# Patient Record
Sex: Female | Born: 1975 | Race: Black or African American | Hispanic: No | Marital: Single | Smoking: Never smoker
Health system: Southern US, Community
[De-identification: ages and names within clinical notes are randomized; demographics above are authoritative.]

## PROBLEM LIST (undated history)

## (undated) DIAGNOSIS — I10 Essential (primary) hypertension: Secondary | ICD-10-CM

## (undated) DIAGNOSIS — R87629 Unspecified abnormal cytological findings in specimens from vagina: Secondary | ICD-10-CM

## (undated) DIAGNOSIS — L02224 Furuncle of groin: Secondary | ICD-10-CM

## (undated) DIAGNOSIS — IMO0002 Reserved for concepts with insufficient information to code with codable children: Secondary | ICD-10-CM

## (undated) DIAGNOSIS — D249 Benign neoplasm of unspecified breast: Secondary | ICD-10-CM

## (undated) HISTORY — DX: Reserved for concepts with insufficient information to code with codable children: IMO0002

## (undated) HISTORY — DX: Unspecified abnormal cytological findings in specimens from vagina: R87.629

## (undated) HISTORY — DX: Essential (primary) hypertension: I10

## (undated) HISTORY — DX: Benign neoplasm of unspecified breast: D24.9

## (undated) HISTORY — DX: Furuncle of groin: L02.224

## (undated) HISTORY — PX: OTHER SURGICAL HISTORY: SHX169

---

## 2005-11-05 ENCOUNTER — Ambulatory Visit: Payer: Self-pay | Admitting: Family Medicine

## 2005-11-05 ENCOUNTER — Other Ambulatory Visit: Admission: RE | Admit: 2005-11-05 | Discharge: 2005-11-05 | Payer: Self-pay | Admitting: Family Medicine

## 2005-11-08 LAB — HM MAMMOGRAPHY: HM Mammogram: ABNORMAL

## 2006-01-23 ENCOUNTER — Ambulatory Visit: Payer: Self-pay | Admitting: Family Medicine

## 2006-10-07 DIAGNOSIS — L708 Other acne: Secondary | ICD-10-CM | POA: Insufficient documentation

## 2007-04-20 ENCOUNTER — Encounter: Payer: Self-pay | Admitting: Family Medicine

## 2007-04-21 ENCOUNTER — Encounter: Payer: Self-pay | Admitting: Family Medicine

## 2007-04-21 ENCOUNTER — Other Ambulatory Visit: Admission: RE | Admit: 2007-04-21 | Discharge: 2007-04-21 | Payer: Self-pay | Admitting: Family Medicine

## 2007-04-21 ENCOUNTER — Ambulatory Visit: Payer: Self-pay | Admitting: Family Medicine

## 2007-04-21 ENCOUNTER — Encounter (INDEPENDENT_AMBULATORY_CARE_PROVIDER_SITE_OTHER): Payer: Self-pay | Admitting: *Deleted

## 2007-04-21 DIAGNOSIS — A630 Anogenital (venereal) warts: Secondary | ICD-10-CM

## 2007-04-21 LAB — CONVERTED CEMR LAB

## 2007-04-22 ENCOUNTER — Telehealth (INDEPENDENT_AMBULATORY_CARE_PROVIDER_SITE_OTHER): Payer: Self-pay | Admitting: *Deleted

## 2007-04-23 ENCOUNTER — Telehealth (INDEPENDENT_AMBULATORY_CARE_PROVIDER_SITE_OTHER): Payer: Self-pay | Admitting: *Deleted

## 2007-04-28 ENCOUNTER — Telehealth (INDEPENDENT_AMBULATORY_CARE_PROVIDER_SITE_OTHER): Payer: Self-pay | Admitting: *Deleted

## 2008-03-30 ENCOUNTER — Ambulatory Visit: Payer: Self-pay | Admitting: Family Medicine

## 2008-03-30 ENCOUNTER — Other Ambulatory Visit: Admission: RE | Admit: 2008-03-30 | Discharge: 2008-03-30 | Payer: Self-pay | Admitting: Family Medicine

## 2008-03-30 ENCOUNTER — Encounter: Payer: Self-pay | Admitting: Family Medicine

## 2008-03-30 DIAGNOSIS — N760 Acute vaginitis: Secondary | ICD-10-CM | POA: Insufficient documentation

## 2008-03-30 DIAGNOSIS — R000 Tachycardia, unspecified: Secondary | ICD-10-CM

## 2008-03-31 ENCOUNTER — Encounter: Payer: Self-pay | Admitting: Family Medicine

## 2008-03-31 LAB — CONVERTED CEMR LAB
Clue Cells Wet Prep HPF POC: NONE SEEN
Hemoglobin: 13.1 g/dL (ref 12.0–15.0)
MCHC: 32 g/dL (ref 30.0–36.0)
RBC: 4.58 M/uL (ref 3.87–5.11)
WBC, Wet Prep HPF POC: NONE SEEN
WBC: 5.9 10*3/uL (ref 4.0–10.5)

## 2008-05-03 ENCOUNTER — Ambulatory Visit: Payer: Self-pay | Admitting: Family Medicine

## 2008-05-03 DIAGNOSIS — J019 Acute sinusitis, unspecified: Secondary | ICD-10-CM

## 2008-06-01 ENCOUNTER — Telehealth: Payer: Self-pay | Admitting: Family Medicine

## 2008-06-09 ENCOUNTER — Other Ambulatory Visit: Admission: RE | Admit: 2008-06-09 | Discharge: 2008-06-09 | Payer: Self-pay | Admitting: Family Medicine

## 2008-06-09 ENCOUNTER — Encounter: Payer: Self-pay | Admitting: Family Medicine

## 2008-06-09 ENCOUNTER — Ambulatory Visit: Payer: Self-pay | Admitting: Family Medicine

## 2008-06-09 LAB — CONVERTED CEMR LAB
ALT: 16 units/L (ref 0–35)
AST: 16 units/L (ref 0–37)
Albumin: 4 g/dL (ref 3.5–5.2)
Calcium: 9.5 mg/dL (ref 8.4–10.5)
Chloride: 105 meq/L (ref 96–112)
LDL Cholesterol: 77 mg/dL (ref 0–99)
Potassium: 3.9 meq/L (ref 3.5–5.3)
Sodium: 138 meq/L (ref 135–145)

## 2008-06-10 ENCOUNTER — Encounter: Payer: Self-pay | Admitting: Family Medicine

## 2008-06-14 ENCOUNTER — Encounter: Payer: Self-pay | Admitting: Family Medicine

## 2008-06-14 ENCOUNTER — Telehealth: Payer: Self-pay | Admitting: Family Medicine

## 2008-06-14 LAB — CONVERTED CEMR LAB
Trich, Wet Prep: NONE SEEN
WBC, Wet Prep HPF POC: NONE SEEN
Yeast Wet Prep HPF POC: NONE SEEN

## 2008-06-17 ENCOUNTER — Telehealth: Payer: Self-pay | Admitting: Family Medicine

## 2008-08-29 ENCOUNTER — Ambulatory Visit: Payer: Self-pay | Admitting: Family Medicine

## 2008-08-29 DIAGNOSIS — G44209 Tension-type headache, unspecified, not intractable: Secondary | ICD-10-CM

## 2008-08-29 DIAGNOSIS — I959 Hypotension, unspecified: Secondary | ICD-10-CM | POA: Insufficient documentation

## 2009-01-03 ENCOUNTER — Ambulatory Visit: Payer: Self-pay | Admitting: Family Medicine

## 2009-01-03 ENCOUNTER — Encounter: Admission: RE | Admit: 2009-01-03 | Discharge: 2009-01-03 | Payer: Self-pay | Admitting: Family Medicine

## 2009-01-03 DIAGNOSIS — R05 Cough: Secondary | ICD-10-CM | POA: Insufficient documentation

## 2009-01-03 DIAGNOSIS — R071 Chest pain on breathing: Secondary | ICD-10-CM

## 2009-01-04 ENCOUNTER — Encounter: Payer: Self-pay | Admitting: Family Medicine

## 2009-01-04 LAB — CONVERTED CEMR LAB
Basophils Absolute: 0 10*3/uL (ref 0.0–0.1)
Eosinophils Absolute: 0 10*3/uL (ref 0.0–0.7)
Lymphocytes Relative: 38 % (ref 12–46)
Lymphs Abs: 2.4 10*3/uL (ref 0.7–4.0)
MCV: 85.9 fL (ref 78.0–100.0)
Neutrophils Relative %: 54 % (ref 43–77)
Platelets: 314 10*3/uL (ref 150–400)
RDW: 12.8 % (ref 11.5–15.5)
WBC: 6.1 10*3/uL (ref 4.0–10.5)

## 2009-03-20 ENCOUNTER — Ambulatory Visit: Payer: Self-pay | Admitting: Family Medicine

## 2009-03-20 DIAGNOSIS — L909 Atrophic disorder of skin, unspecified: Secondary | ICD-10-CM | POA: Insufficient documentation

## 2009-03-20 DIAGNOSIS — L919 Hypertrophic disorder of the skin, unspecified: Secondary | ICD-10-CM

## 2009-09-22 ENCOUNTER — Telehealth (INDEPENDENT_AMBULATORY_CARE_PROVIDER_SITE_OTHER): Payer: Self-pay | Admitting: *Deleted

## 2009-09-28 ENCOUNTER — Encounter: Payer: Self-pay | Admitting: Family Medicine

## 2009-09-28 ENCOUNTER — Other Ambulatory Visit: Admission: RE | Admit: 2009-09-28 | Discharge: 2009-09-28 | Payer: Self-pay | Admitting: Family Medicine

## 2009-09-28 ENCOUNTER — Ambulatory Visit: Payer: Self-pay | Admitting: Family Medicine

## 2009-09-28 DIAGNOSIS — R635 Abnormal weight gain: Secondary | ICD-10-CM

## 2009-10-19 ENCOUNTER — Telehealth (INDEPENDENT_AMBULATORY_CARE_PROVIDER_SITE_OTHER): Payer: Self-pay | Admitting: *Deleted

## 2010-11-20 ENCOUNTER — Ambulatory Visit: Payer: Self-pay | Admitting: Family Medicine

## 2010-11-20 ENCOUNTER — Other Ambulatory Visit: Admission: RE | Admit: 2010-11-20 | Discharge: 2010-11-20 | Payer: Self-pay | Admitting: Family Medicine

## 2010-11-21 LAB — CONVERTED CEMR LAB
ALT: 16 units/L (ref 0–35)
Albumin: 4.2 g/dL (ref 3.5–5.2)
Alkaline Phosphatase: 64 units/L (ref 39–117)
Glucose, Bld: 97 mg/dL (ref 70–99)
HCV Ab: NEGATIVE
HIV: NONREACTIVE
LDL Cholesterol: 98 mg/dL (ref 0–99)
Potassium: 4 meq/L (ref 3.5–5.3)
Sodium: 138 meq/L (ref 135–145)
Total Bilirubin: 0.6 mg/dL (ref 0.3–1.2)
Total Protein: 7 g/dL (ref 6.0–8.3)
Triglycerides: 90 mg/dL (ref <150)
VLDL: 18 mg/dL (ref 0–40)

## 2010-11-26 LAB — CONVERTED CEMR LAB: Pap Smear: NEGATIVE

## 2011-01-29 NOTE — Assessment & Plan Note (Signed)
Summary: CPE with pap   Vital Signs:  Patient profile:   35 year old female Menstrual status:  regular LMP:     11/13/2010 Height:      67 inches Weight:      193 pounds BMI:     30.34 O2 Sat:      99 % on Room air Pulse rate:   105 / minute BP sitting:   135 / 88  (left arm) Cuff size:   large  Vitals Entered By: Payton Spark CMA (November 20, 2010 2:57 PM)  O2 Flow:  Room air CC: CPE w/ pap LMP (date): 11/13/2010     Menstrual Status regular Enter LMP: 11/13/2010 Last PAP Result NEGATIVE FOR INTRAEPITHELIAL LESIONS OR MALIGNANCY.   Primary Care Provider:  Seymour Bars DO  CC:  CPE w/ pap.  History of Present Illness: 35 yo AAF  presents for CPE with pap.  She got married this year but is now going thru a divorce.  She has  had an abnormal pap smear in 1999.Marland Kitchen  She is still working as a Engineer, civil (consulting).  She has gained 13 lbs this year with the stress of separation and upcoming divorce.  Denies fam hx of premature heart dz, colon cancer or breast cancer.  Not taking her vitamins and is not exercising.  Tetanus UTD.  Getting flu shot at work.  Would like STD testing done.  Current Medications (verified): 1)  Camila 0.35 Mg Tabs (Norethindrone (Contraceptive)) .Marland Kitchen.. 1 Tab By Mouth Daily  Allergies (verified): No Known Drug Allergies  Past History:  Past Medical History: recurrent R groin boil G0  Past Surgical History: Reviewed history from 04/20/2007 and no changes required. breast ultrasound- L breast solid or complex nodule  L breast needle bx: fibroadenoma  Family History: Reviewed history from 04/20/2007 and no changes required. brother healthy  grandfather- diabetes, stroke  mother - epilepsy and father alive, both w/ HTN  Social History: Reviewed history from 10/07/2006 and no changes required. 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  [place] from [place] in 2005.  Has boyfriend, sexually active, uses condoms.  Works 3rd shift.  Poor diet, no exercise.  Nonsmoker.   G0.  Review of Systems       The patient complains of weight gain.  The patient denies anorexia, fever, weight loss, vision loss, decreased hearing, hoarseness, chest pain, syncope, dyspnea on exertion, peripheral edema, prolonged cough, headaches, hemoptysis, abdominal pain, melena, hematochezia, severe indigestion/heartburn, hematuria, incontinence, genital sores, muscle weakness, suspicious skin lesions, transient blindness, difficulty walking, depression, unusual weight change, abnormal bleeding, enlarged lymph nodes, angioedema, breast masses, and testicular masses.    Physical Exam  General:  alert, well-developed, well-nourished, well-hydrated, and overweight-appearing.   Head:  normocephalic and atraumatic.   Eyes:  pupils equal, pupils round, and pupils reactive to light.   Ears:  no external deformities.   Nose:  no nasal discharge.   Mouth:  good dentition and pharynx pink and moist.   Neck:  no masses.   Breasts:  No mass, nodules, thickening, tenderness, bulging, retraction, inflamation, nipple discharge or skin changes noted.   Lungs:  Normal respiratory effort, chest expands symmetrically. Lungs are clear to auscultation, no crackles or wheezes. Heart:  Normal rate and regular rhythm. S1 and S2 normal without gallop, murmur, click, rub or other extra sounds. Abdomen:  Bowel sounds positive,abdomen soft and non-tender without masses, organomegaly or hernias noted. Genitalia:  Pelvic Exam:        External: normal female genitalia without  lesions or masses        Vagina: normal without lesions or masses        Cervix: normal without lesions or masses        Adnexa: normal bimanual exam without masses or fullness        Uterus: normal by palpation        Pap smear: performed Pulses:  2+ radial and pedal pulses Extremities:  no LE edema Skin:  color normal.   Cervical Nodes:  No lymphadenopathy noted Psych:  good eye contact, not anxious appearing, and not depressed appearing.      Impression & Recommendations:  Problem # 1:  ROUTINE GYNECOLOGICAL EXAMINATION (ICD-V72.31) Keeping healthy checklist for women reviewed. BP  in pre - HTN range.  BMI 30 c/w class I obesity. Work on [employer], regular exercise, wt loss. Pap and STD testing done today. Tetanus UTD. Update fasting labs. Call if any worsening in mood given life stressors. Continue Camilla, RFd. RTC 1 yr, sooner if needed.  Complete Medication List: 1)  Camila 0.35 Mg Tabs (Norethindrone (contraceptive)) .Marland Kitchen.. 1 tab by mouth daily  Other Orders: T-Comprehensive Metabolic Panel [id number]) T-Lipid Profile [id number]) T-Hepatitis C Antibody ([id number]) T-HIV Antibody  (Reflex) [id number]) T-RPR (Syphilis) ([id number])  Patient Instructions: 1)  Will call you with pap smear results next wk. 2)  Update fasting labs one morning. 3)  Will call you w/ results. 4)  Work on [employer], regular exercise. 5)  Call if any worsening in mood. 6)  Camilla RFd. 7)  Return in 1 yr, sooner if needed. Prescriptions: CAMILA 0.35 MG TABS (NORETHINDRONE (CONTRACEPTIVE)) 1 tab by mouth daily  #1 pack x 12   Entered and Authorized by:   Seymour Bars DO   Signed by:   Seymour Bars DO on 11/20/2010   Method used:   Print then Give to Patient   RxID:   [id number]    Orders Added: 1)  T-Comprehensive Metabolic Panel [[id number]] 2)  T-Lipid Profile [[id number]] 3)  T-Hepatitis C Antibody [[id number]] 4)  T-HIV Antibody  (Reflex) [[id number]] 5)  T-RPR (Syphilis) [[id number]] 6)  Est. Patient age 47-39 [phone]

## 2011-09-08 ENCOUNTER — Encounter: Payer: Self-pay | Admitting: Family Medicine

## 2011-09-09 ENCOUNTER — Ambulatory Visit (INDEPENDENT_AMBULATORY_CARE_PROVIDER_SITE_OTHER): Payer: PRIVATE HEALTH INSURANCE | Admitting: Family Medicine

## 2011-09-09 VITALS — BP 146/96 | HR 135 | Wt 193.0 lb

## 2011-09-09 DIAGNOSIS — N9489 Other specified conditions associated with female genital organs and menstrual cycle: Secondary | ICD-10-CM

## 2011-09-09 DIAGNOSIS — N949 Unspecified condition associated with female genital organs and menstrual cycle: Secondary | ICD-10-CM

## 2011-09-09 MED ORDER — VALACYCLOVIR HCL 1 G PO TABS: 2000.0000 mg | ORAL_TABLET | Freq: Two times a day (BID) | ORAL | Status: DC

## 2011-09-09 NOTE — Progress Notes (Signed)
  Subjective:    Patient ID: Misty Hodge, female    DOB: 1976/09/03, 35 y.o.   MRN: [id number]  HPI  About a year ago she had a divorce had neg STD testing.  Boyfriend recently dx with HSV.  She is now worried he may have given it her.  About 7 days ago noticed a rash and then turned into a bump.  Burned initially when water would hit it.  Now looks like like an open area. No drainage. No fever or aches.  No topical tx. She says she always uses condoms.   Review of Systems     Objective:   Physical Exam Ulcerated area on the Lt inner buttock approx 1 cm . Appears to be healing. Looks more dry.        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 It is alredy healing so didn't do a viral culture as will likely be negative. If gets another lesion then come in and wills swab the area to confirm   BP elevated today - Discussed she will have it checked at [employer] at work an call with results.

## 2011-11-13 ENCOUNTER — Other Ambulatory Visit (HOSPITAL_COMMUNITY)
Admission: RE | Admit: 2011-11-13 | Discharge: 2011-11-13 | Disposition: A | Discharge status: Home / Self Care | Payer: PRIVATE HEALTH INSURANCE | Source: Ambulatory Visit | Attending: Family Medicine | Admitting: Family Medicine

## 2011-11-13 ENCOUNTER — Other Ambulatory Visit: Payer: Self-pay | Admitting: *Deleted

## 2011-11-13 ENCOUNTER — Encounter: Payer: Self-pay | Admitting: Family Medicine

## 2011-11-13 ENCOUNTER — Ambulatory Visit (INDEPENDENT_AMBULATORY_CARE_PROVIDER_SITE_OTHER): Payer: PRIVATE HEALTH INSURANCE | Admitting: Family Medicine

## 2011-11-13 VITALS — BP 153/94 | HR 134 | Wt 196.0 lb

## 2011-11-13 DIAGNOSIS — Z01419 Encounter for gynecological examination (general) (routine) without abnormal findings: Secondary | ICD-10-CM

## 2011-11-13 DIAGNOSIS — Z1159 Encounter for screening for other viral diseases: Secondary | ICD-10-CM | POA: Insufficient documentation

## 2011-11-13 MED ORDER — LISINOPRIL 20 MG PO TABS: 20.0000 mg | ORAL_TABLET | Freq: Every day | ORAL | Status: DC

## 2011-11-13 MED ORDER — MUPIROCIN 2 % EX OINT: TOPICAL_OINTMENT | Freq: Three times a day (TID) | CUTANEOUS | Status: AC

## 2011-11-13 MED ORDER — NORETHINDRONE 0.35 MG PO TABS: 1.0000 | ORAL_TABLET | Freq: Every day | ORAL | Status: DC

## 2011-11-13 NOTE — Progress Notes (Signed)
Subjective:     Misty Hodge is a 35 y.o. female and is here for a comprehensive physical exam. The patient reports no problems.She has gained weight and is not exercising. She says she still has that lesion near her left inner labia. He thought initially it was herpes simplex but the lesion has not resolved. It is otherwise not tender or bothersome. She has noted some numbness in her left buttock radiating into her leg. She experiences initially when she first noticed the lesion. It did seem to have resolved but started again a couple days ago.  History   Social History  . Marital Status: Single    Spouse Name: N/A    Number of Children: N/A  . Years of Education: N/A   Occupational History  . Not on file.   Social History Main Topics  . Smoking status: Never Smoker   . Smokeless tobacco: Not on file  . Alcohol Use: 0.0 - 0.5 oz/week    0-1 drink(s) per week  . Drug Use: Not on file  . Sexually Active: Yes -- Female partner(s)    Birth Control/ Protection: Condom   Other Topics Concern  . Not on file   Social History Narrative   No regular exercise.    Health Maintenance  Topic Date Due  . Pap Smear  10/03/1996  . Influenza Vaccine  09/30/2011  . Tetanus/tdap  04/20/2017    The following portions of the patient's history were reviewed and updated as appropriate: allergies, current medications, past family history, past medical history, past social history, past surgical history and problem list.  Review of Systems A comprehensive review of systems was negative.   Objective:    BP 153/94  Pulse 134  Wt 196 lb (88.905 kg) General appearance: alert, cooperative and appears stated age Head: Normocephalic, without obvious abnormality, atraumatic Eyes: conjunctiva clear, EOMi, PEERLA Ears: normal TM's and external ear canals both ears Nose: Nares normal. Septum midline. Mucosa normal. No drainage or sinus tenderness. Throat: lips, mucosa, and tongue normal; teeth and  gums normal Neck: no adenopathy, no carotid bruit, no JVD, supple, symmetrical, trachea midline and thyroid not enlarged, symmetric, no tenderness/mass/nodules Back: symmetric, no curvature. ROM normal. No CVA tenderness. Lungs: clear to auscultation bilaterally Breasts: normal appearance, no masses or tenderness Heart: regular rate and rhythm, S1, S2 normal, no murmur, click, rub or gallop Abdomen: soft, non-tender; bowel sounds normal; no masses,  no organomegaly Pelvic: cervix normal in appearance, external genitalia normal, no adnexal masses or tenderness, no cervical motion tenderness, rectovaginal septum normal, uterus normal size, shape, and consistency and vagina normal without discharge. She does have a small approximately half a centimeter ulcerated area on the left inner labia near her buttock. It's about the same size as it was at her last exam. Extremities: extremities normal, atraumatic, no cyanosis or edema Pulses: 2+ and symmetric Skin: Skin color, texture, turgor normal. No rashes or lesions Lymph nodes: Cervical, supraclavicular, and axillary nodes normal. Neurologic: Grossly normal    Assessment:    Healthy female exam.    Plan:     See After Visit Summary for Counseling Recommendations  We discussed the importance of weight loss Start a regular exercise program and make sure you are eating a healthy diet Try to eat 4 servings of dairy a day or take a calcium supplement (500mg  twice a day). Your vaccines are up to date.  Will try Bactroban ointment on the lesion her labia. It looks consistent with  an ulcer if it continues to persist then please let me know.

## 2011-11-13 NOTE — Patient Instructions (Signed)
Start a regular exercise program and make sure you are eating a healthy diet Try to eat 4 servings of dairy a day or take a calcium supplement (500mg twice a day). Your vaccines are up to date.   

## 2011-11-14 LAB — COMPLETE METABOLIC PANEL WITH GFR
BUN: 11 mg/dL (ref 6–23)
CO2: 22 mEq/L (ref 19–32)
Calcium: 9 mg/dL (ref 8.4–10.5)
Chloride: 106 mEq/L (ref 96–112)
Creat: 0.71 mg/dL (ref 0.50–1.10)
GFR, Est African American: >89 mL/min/{1.73_m2}
GFR, Est Non African American: >89 mL/min/{1.73_m2}
Glucose, Bld: 99 mg/dL (ref 70–99)
Total Bilirubin: 0.3 mg/dL (ref 0.3–1.2)

## 2011-11-14 LAB — LIPID PANEL
Cholesterol: 146 mg/dL (ref 0–200)
HDL: 49 mg/dL (ref >39)
LDL Cholesterol: 82 mg/dL (ref 0–99)
Triglycerides: 73 mg/dL (ref <150)

## 2012-08-19 ENCOUNTER — Other Ambulatory Visit: Payer: Self-pay | Admitting: Family Medicine

## 2012-11-04 ENCOUNTER — Other Ambulatory Visit: Payer: Self-pay | Admitting: *Deleted

## 2012-11-04 MED ORDER — VALACYCLOVIR HCL 1 G PO TABS: 2000.0000 mg | ORAL_TABLET | Freq: Two times a day (BID) | ORAL | Status: AC

## 2012-12-09 ENCOUNTER — Ambulatory Visit (INDEPENDENT_AMBULATORY_CARE_PROVIDER_SITE_OTHER): Payer: PRIVATE HEALTH INSURANCE | Admitting: Family Medicine

## 2012-12-09 ENCOUNTER — Ambulatory Visit (INDEPENDENT_AMBULATORY_CARE_PROVIDER_SITE_OTHER): Payer: PRIVATE HEALTH INSURANCE

## 2012-12-09 VITALS — BP 157/101 | HR 128 | Temp 98.7°F | Wt 189.0 lb

## 2012-12-09 DIAGNOSIS — R002 Palpitations: Secondary | ICD-10-CM

## 2012-12-09 DIAGNOSIS — R000 Tachycardia, unspecified: Secondary | ICD-10-CM

## 2012-12-09 LAB — COMPLETE METABOLIC PANEL WITH GFR
ALT: 15 U/L (ref 0–35)
AST: 18 U/L (ref 0–37)
Albumin: 4.1 g/dL (ref 3.5–5.2)
BUN: 11 mg/dL (ref 6–23)
CO2: 24 mEq/L (ref 19–32)
Calcium: 9.6 mg/dL (ref 8.4–10.5)
Chloride: 102 mEq/L (ref 96–112)
Creat: 0.72 mg/dL (ref 0.50–1.10)
GFR, Est African American: >89 mL/min
Potassium: 4.3 mEq/L (ref 3.5–5.3)

## 2012-12-09 LAB — CBC
MCH: 29 pg (ref 26.0–34.0)
MCV: 88.6 fL (ref 78.0–100.0)
Platelets: 317 10*3/uL (ref 150–400)
RBC: 5 MIL/uL (ref 3.87–5.11)
RDW: 13.1 % (ref 11.5–15.5)
WBC: 7.8 10*3/uL (ref 4.0–10.5)

## 2012-12-09 NOTE — Progress Notes (Signed)
Chest x-ray is unremarkable, electrolytes are within normal limits, no sign of anemia, awaiting TSH

## 2012-12-09 NOTE — Progress Notes (Signed)
CC: Misty Hodge is a 36 y.o. female is here for chest discomfort   Subjective: HPI:  Patient reports a vibration discomfort in the left anterior chest that started Saturday after no specific precipitating event. He continues to occur off and on throughout the day, it does not wake her from sleep but she wakes up in the night from other reasons she notices is still present. She denies any pain but it is annoying. Nothing seems to make it better or worse. She describes it as a sensation that her cell phone sat on vibrate is sitting on her chest. Vibrations do not move. She had some nausea on Sunday but no interference with appetite or other episodes of nausea. She denies shortness of breath other than after climbing 3 flights of stairs yesterday. She denies orthopnea, fevers, chills, back pain, confusion, motor or sensory disturbances, cough, wheezing.  She describes herself as individual enjoys caffeine, however she does not take any caffeine since Saturday following the onset of her complaints.  There's been no recent immobilization, she is on estrogen, there is no personal history of blood clots, her most recent Pap did not show abnormalities.   Review Of Systems Outlined In HPI  Past Medical History  Diagnosis Date  . Boil, groin     recurrent of RT  . GOA (generalized osteoarthritis)   . Fibroadenoma      Family History  Problem Relation Age of Onset  . Diabetes Maternal Grandfather   . Stroke Maternal Grandfather   . Seizures Mother   . Hypertension Father   . Hypertension Mother      History  Substance Use Topics  . Smoking status: Never Smoker   . Smokeless tobacco: Not on file  . Alcohol Use: 0.0 - 0.5 oz/week    0-1 drink(s) per week     Objective: Filed Vitals:   12/09/12 0913  BP: 157/101  Pulse: 128  Temp: 98.7 F (37.1 C)    General: Alert and Oriented, No Acute Distress HEENT: Pupils equal, round, reactive to light. Conjunctivae clear.  Pink inferior  turbinates.  Moist mucous membranes, pharynx without inflammation nor lesions.  Neck supple without palpable lymphadenopathy nor abnormal masses. Lungs: Clear to auscultation bilaterally, no wheezing/ronchi/rales.  Comfortable work of breathing. Good air movement. Cardiac: Tachycardic Normal S1/S2.  No murmurs, rubs, nor gallops.  In the Extremities: No peripheral edema.  Strong peripheral pulses.  Mental Status: No depression, anxiety, nor agitation. Skin: Warm and dry.  Assessment & Plan: Misty Hodge was seen today for chest discomfort.  Diagnoses and associated orders for this visit:  Palpitations - PR ELECTROCARDIOGRAM, COMPLETE - TSH - COMPLETE METABOLIC PANEL WITH GFR - CBC - DG Chest 2 View; Future    EKG shows sinus tachycardia, normal axis, normal intervals, no pathologic Q waves, no ST segment elevation or depression, good R wave progression, 2 non-coupled PVCs.  Very low suspicion for cardiac ischemia or infarction.  Wells score 1.5, clinically PE is unlikely.  Stat labs above ordered to rule out reversible causes of tachycardia and PVCs. Will contact the patient once his labs are back.Signs and symptoms requring emergent/urgent reevaluation were discussed with the patient. May possibly need event monitor if above labs are normal  40 minutes spent face-to-face during visit today of which at least 50% was counseling or coordinating care.   Followup will be based on results of above

## 2012-12-09 NOTE — Progress Notes (Signed)
Updated patient regarding favorable TSH, CMP, CBC. She is open to the idea of getting set up for an event monitor.

## 2012-12-09 NOTE — Addendum Note (Signed)
Addended by: Laren Boom on: 12/09/2012 04:13 PM   Modules accepted: Orders

## 2012-12-10 ENCOUNTER — Telehealth: Payer: Self-pay | Admitting: *Deleted

## 2012-12-10 ENCOUNTER — Encounter: Payer: Self-pay | Admitting: *Deleted

## 2012-12-10 NOTE — Telephone Encounter (Signed)
Pt states the palpitations are more frequent and now she feels sore in the area that she has the palpitations.Pt wants to know if she can take Ibuprofen for the soreness. Also I scheduled the appt for event monitor for mon. Pt wants to know if its ok to wait until then

## 2012-12-10 NOTE — Telephone Encounter (Signed)
Pt.notified

## 2012-12-10 NOTE — Telephone Encounter (Signed)
Yes it's ok to take Ibuprofen: 400-600mg  every 8 hours as needed.  And based on yesterdays reassuring results I think it's ok to wait until Monday for the monitor. However, if she feels they're continuing to get worse it wouldn't be unreasonable to schedule an appointment tomorrow to discuss whether or not to perform a longer EKG / Rhythm Strip.

## 2013-01-11 ENCOUNTER — Telehealth: Payer: Self-pay | Admitting: Family Medicine

## 2013-01-11 NOTE — Telephone Encounter (Signed)
Call patient: Her monitor results showed that she does not have any abnormal rhythms. Ice it shows normal sinus rhythm with no arrhythmia. This is very reassuring. If she's still having the sensation occurring then please make a followup appointment with me.

## 2013-03-22 ENCOUNTER — Encounter: Payer: Self-pay | Admitting: Family Medicine

## 2013-03-22 ENCOUNTER — Ambulatory Visit (INDEPENDENT_AMBULATORY_CARE_PROVIDER_SITE_OTHER): Payer: PRIVATE HEALTH INSURANCE | Admitting: Family Medicine

## 2013-03-22 VITALS — BP 145/87 | HR 115 | Ht 67.0 in | Wt 180.0 lb

## 2013-03-22 DIAGNOSIS — N920 Excessive and frequent menstruation with regular cycle: Secondary | ICD-10-CM

## 2013-03-22 DIAGNOSIS — N898 Other specified noninflammatory disorders of vagina: Secondary | ICD-10-CM

## 2013-03-22 DIAGNOSIS — Z3009 Encounter for other general counseling and advice on contraception: Secondary | ICD-10-CM

## 2013-03-22 DIAGNOSIS — G44229 Chronic tension-type headache, not intractable: Secondary | ICD-10-CM

## 2013-03-22 MED ORDER — NORETHINDRONE 0.35 MG PO TABS: 1.0000 | ORAL_TABLET | Freq: Every day | ORAL | Status: DC

## 2013-03-22 MED ORDER — NOR-QD 0.35 MG PO TABS: ORAL_TABLET | ORAL | Status: DC

## 2013-03-22 NOTE — Progress Notes (Addendum)
  Subjective:    Patient ID: Misty Hodge, female    DOB: October 15, 1976, 37 y.o.   MRN: [id number]  HPI She's here to discuss her birth control today. She was previously on nor-QD. This really worked well for her. It controlled her periods she was previously having migraines that got worse around her cycle when she was on combination estrogen and progesterone birth control. Thus a couple years ago she switched to progesterone only birth control and that has worked well for her. She says she never misses a dose it always takes it at the same time. She switch to a generic call camila about 6 months ago. About 2 months after switching she has her to having breakthrough bleeding in the middle of her cycle. She does still have headaches around the time of her period she is otherwise not having any abdominal pain pelvic pain, abnormal discharge et Karie Soda. Her Pap smear is up-to-date.  She is getting married in May and wants to have this under control if possible.  Review of Systems     Objective:   Physical Exam  Constitutional: She is oriented to person, place, and time. She appears well-developed and well-nourished.  HENT:  Head: Normocephalic and atraumatic.  Eyes: Conjunctivae and EOM are normal.  Cardiovascular: Normal rate.   Pulmonary/Chest: Effort normal.  Neurological: She is alert and oriented to person, place, and time.  Skin: Skin is dry. No pallor.  Psychiatric: She has a normal mood and affect. Her behavior is normal.          Assessment & Plan:  Contraceptive counseling - Will change to brand Nor-QD. Hopefully this will get her periods better regulated. F/u in 2 months. If still bleeding at this point then consider US of the uterus to evaluate for endometrial hyperplasia. Could also consider switching to accommodation estrogen progesterone birth control pill.   Tension headaches-She still gets headaches around the time of her period. They did get worse after switching to the  camila brand.  We will see if these improve as well getting back on the brand Nor-QD.  If not then consider going back to combination pill.  Elevated blood pressure-her blood pressure is slightly high today. She was on lisinopril at one time. Open 2 months to recheck blood pressure.

## 2014-03-08 ENCOUNTER — Ambulatory Visit (INDEPENDENT_AMBULATORY_CARE_PROVIDER_SITE_OTHER): Payer: PRIVATE HEALTH INSURANCE | Admitting: Physician Assistant

## 2014-03-08 ENCOUNTER — Encounter: Payer: Self-pay | Admitting: Physician Assistant

## 2014-03-08 VITALS — BP 148/97 | HR 102 | Wt 198.0 lb

## 2014-03-08 DIAGNOSIS — N920 Excessive and frequent menstruation with regular cycle: Secondary | ICD-10-CM

## 2014-03-08 DIAGNOSIS — N898 Other specified noninflammatory disorders of vagina: Secondary | ICD-10-CM

## 2014-03-08 DIAGNOSIS — I10 Essential (primary) hypertension: Secondary | ICD-10-CM

## 2014-03-08 DIAGNOSIS — N926 Irregular menstruation, unspecified: Secondary | ICD-10-CM

## 2014-03-08 LAB — POCT URINE PREGNANCY: Preg Test, Ur: NEGATIVE

## 2014-03-08 MED ORDER — HYDROCHLOROTHIAZIDE 12.5 MG PO TABS: 12.5000 mg | ORAL_TABLET | Freq: Every day | ORAL | Status: DC

## 2014-03-08 NOTE — Progress Notes (Signed)
   Subjective:    Patient ID: Misty Hodge, female    DOB: 1976-10-19, 38 y.o.   MRN: [id number]  HPI Pt is a 38 yo female who presents to the clinic to follow up after missed period on birth control this month. She was supposed to start her period 02/27/14. Cycle usually last for 5 days. She spotting on 02/27/14 but period never came. She has not missed a dose and takes pill at same time every day. She has been on Nor-QD for over 3 years with no problems. She felts some mild cramping on 02/27/14 but no pain since. Denies any nausea or vomiting. Home pregnancy test have been negative.   Elevated BP- hx of elevation. Denies trying any medication. No CP, palpitations, SOB, headaches, of vision changes.    Review of Systems     Objective:   Physical Exam  Constitutional: She is oriented to person, place, and time. She appears well-developed and well-nourished.  HENT:  Head: Normocephalic and atraumatic.  Cardiovascular:  Tachycardia at 102.   Pulmonary/Chest: Effort normal and breath sounds normal.  Neurological: She is alert and oriented to person, place, and time.  Skin: Skin is dry.  Psychiatric: She has a normal mood and affect. Her behavior is normal.          Assessment & Plan:  Missed period/light spotting- Will check serum HCG to confirm not pregnant. Discussed with pt stress, change in diet could all affect hormones. Continue to take OCP daily at same time. If continuing not to have a full period could consider withdrawal bleed by stopping OCP and going from there. Since no pain or excessive bleeding no need for pelvic ultrasound at this point. Could also consider check TsH and other hormones at some point.    HTN- discussed treating BP since been talked about for over a year but nothing done. Discussed risk factors for HTN not treated. She does not want to try an ACE because of the SE of cough. Started on HCTZ only will recheck BP in 4 weeks.

## 2014-03-09 LAB — HCG, SERUM, QUALITATIVE: PREG SERUM: NEGATIVE

## 2014-03-18 ENCOUNTER — Encounter: Payer: PRIVATE HEALTH INSURANCE | Admitting: Family Medicine

## 2014-05-31 ENCOUNTER — Other Ambulatory Visit: Payer: Self-pay | Admitting: Physician Assistant

## 2014-05-31 ENCOUNTER — Other Ambulatory Visit: Payer: Self-pay | Admitting: Family Medicine

## 2014-10-05 ENCOUNTER — Ambulatory Visit (INDEPENDENT_AMBULATORY_CARE_PROVIDER_SITE_OTHER): Payer: PRIVATE HEALTH INSURANCE | Admitting: Family Medicine

## 2014-10-05 ENCOUNTER — Encounter: Payer: Self-pay | Admitting: Family Medicine

## 2014-10-05 VITALS — BP 160/108 | HR 113 | Wt 209.0 lb

## 2014-10-05 DIAGNOSIS — E669 Obesity, unspecified: Secondary | ICD-10-CM

## 2014-10-05 DIAGNOSIS — I10 Essential (primary) hypertension: Secondary | ICD-10-CM

## 2014-10-05 DIAGNOSIS — R002 Palpitations: Secondary | ICD-10-CM

## 2014-10-05 DIAGNOSIS — R000 Tachycardia, unspecified: Secondary | ICD-10-CM

## 2014-10-05 MED ORDER — METOPROLOL SUCCINATE ER 25 MG PO TB24: 25.0000 mg | ORAL_TABLET | Freq: Every day | ORAL | Status: DC

## 2014-10-05 MED ORDER — HYDROCHLOROTHIAZIDE 12.5 MG PO CAPS: 12.5000 mg | ORAL_CAPSULE | Freq: Every day | ORAL | Status: DC

## 2014-10-05 NOTE — Progress Notes (Signed)
Subjective:    Patient ID: Misty Hodge, female    DOB: 05/12/1976, 38 y.o.   MRN: [id number]  Palpitations    C/O hear palpitatins. She had had french fries and starbucks and thought maybe it was reflux related.  No SOB. Took a TUMs initially.  Next night went out to dinner for her birthday.   Took some zantac that night and got some mild relief by the next morning. .  No dizziness.  Seems better when she lays down.  Says felt like her heart was pounding.  Says she has been taking her BP meds regularly. She has gained some weight since go married a year ago.    Review of Systems  Cardiovascular: Positive for palpitations.   BP 160/108  Pulse 113  Wt 209 lb (94.802 kg)    No Known Allergies  Past Medical History  Diagnosis Date  . Boil, groin     recurrent of RT  . GOA (generalized osteoarthritis)   . Fibroadenoma     Past Surgical History  Procedure Laterality Date  . Lt breast solid or complex nodule    . Lt breast needle biopsy      History   Social History  . Marital Status: Single    Spouse Name: N/A    Number of Children: N/A  . Years of Education: N/A   Occupational History  . Not on file.   Social History Main Topics  . Smoking status: Never Smoker   . Smokeless tobacco: Not on file  . Alcohol Use: 0.0 - 0.5 oz/week    0-1 drink(s) per week  . Drug Use: Not on file  . Sexual Activity: Yes    Partners: Male    Birth Control/ Protection: Condom   Other Topics Concern  . Not on file   Social History Narrative   No regular exercise.     Family History  Problem Relation Age of Onset  . Diabetes Maternal Grandfather   . Stroke Maternal Grandfather   . Seizures Mother   . Hypertension Father   . Hypertension Mother     Outpatient Encounter Prescriptions as of 10/05/2014  Medication Sig  . hydrochlorothiazide (MICROZIDE) 12.5 MG capsule Take 1 capsule (12.5 mg total) by mouth daily.  . metoprolol succinate (TOPROL-XL) 25 MG 24 hr  tablet Take 1 tablet (25 mg total) by mouth daily.  . NOR-QD 0.35 MG tablet TAKE 1 TABLET BY MOUTH DAILY.  . [DISCONTINUED] hydrochlorothiazide (MICROZIDE) 12.5 MG capsule TAKE 1 CAPSULE BY MOUTH DAILY.           Objective:   Physical Exam  Constitutional: She is oriented to person, place, and time. She appears well-developed and well-nourished.  HENT:  Head: Normocephalic and atraumatic.  Cardiovascular: Regular rhythm and normal heart sounds.   + tachycardia  Pulmonary/Chest: Effort normal and breath sounds normal.  Neurological: She is alert and oriented to person, place, and time.  Skin: Skin is warm and dry.  Psychiatric: She has a normal mood and affect. Her behavior is normal.          Assessment & Plan:  Palpitations - most likely benign. She's had a prior history of palpitations a couple years ago when she was under a lot of stress. Since then she has gained weight and her blood pressures not well controlled. Recommend we start a beta blocker to help lower her pulse, control palpitations, and lower her blood pressure. She's currently taking hydrochlorothiazide 12.5 mg daily  and so she is consistent with it. EKG today shows rate of 110 beats per minute, normal sinus rhythm with normal axis and no acute changes. Looking back she has had significantly elevated blood pressures of the last 4 years and typically is mildly tachycardic.  Tachycardia-will start beta blocker. We'll followup in 4 weeks and adjust dose if needed.  Hypertension-uncontrolled. Will start beta blocker in addition to her diuretic. Followup in 4 weeks to adjust the medication. In the meantime also encouraged her to work on eating a low salt diet. She admits she's been using a lot of crockpot mixes which tend to have a lot of salt. Also encouraged her to get into a regular workout routine and start working on losing 30 pounds she has gained over the last year.

## 2014-10-12 ENCOUNTER — Telehealth: Payer: Self-pay

## 2014-10-12 NOTE — Telephone Encounter (Signed)
Misty Hodge reports mild chest burning, headaches and left arm pain. She believes this is all due to side effects of Metoprolol. Denies shortness of breath, nausea, vomiting, pressure in chest or dizziness. Denies early onset of heart attack or stoke in her family history. She has been schedule for a follow up tomorrow. Patient advised to go to ED if she starts having increased chest pain, shortness of breath, dizziness, nausea or vomiting.

## 2014-10-13 ENCOUNTER — Ambulatory Visit (INDEPENDENT_AMBULATORY_CARE_PROVIDER_SITE_OTHER): Payer: PRIVATE HEALTH INSURANCE | Admitting: Family Medicine

## 2014-10-13 ENCOUNTER — Encounter: Payer: Self-pay | Admitting: Family Medicine

## 2014-10-13 VITALS — BP 147/92 | HR 100 | Wt 207.0 lb

## 2014-10-13 DIAGNOSIS — R000 Tachycardia, unspecified: Secondary | ICD-10-CM

## 2014-10-13 DIAGNOSIS — K2100 Gastro-esophageal reflux disease with esophagitis, without bleeding: Secondary | ICD-10-CM

## 2014-10-13 DIAGNOSIS — R0789 Other chest pain: Secondary | ICD-10-CM

## 2014-10-13 DIAGNOSIS — I10 Essential (primary) hypertension: Secondary | ICD-10-CM

## 2014-10-13 MED ORDER — NEBIVOLOL HCL 5 MG PO TABS: ORAL_TABLET | ORAL | Status: DC

## 2014-10-13 MED ORDER — ESOMEPRAZOLE MAGNESIUM 40 MG PO CPDR: 40.0000 mg | DELAYED_RELEASE_CAPSULE | Freq: Every day | ORAL | Status: DC

## 2014-10-13 NOTE — Progress Notes (Signed)
Subjective:    Patient ID: Misty Hodge, female    DOB: March 07, 1976, 38 y.o.   MRN: [id number]  HPI Burning sensation in the mid sterun started on Monday about 4 days ago.  She did try TUMs and did give her temporary relief.  No nausea.  No constipation. She feels regular.  No swelling of the extremities. Has been more gassy recently.  No pelvic pain ore pressure. He has had some occasional pain between the scapula and the spine on the left side. She feels like the pain in her left shoulder and upper arm is more of an ache. The sensation in her chest is more of a burning sensation.    Review of Systems  There were no vitals taken for this visit.    No Known Allergies  Past Medical History  Diagnosis Date  . Boil, groin     recurrent of RT  . GOA (generalized osteoarthritis)   . Fibroadenoma     Past Surgical History  Procedure Laterality Date  . Lt breast solid or complex nodule    . Lt breast needle biopsy      History   Social History  . Marital Status: Single    Spouse Name: N/A    Number of Children: N/A  . Years of Education: N/A   Occupational History  . Not on file.   Social History Main Topics  . Smoking status: Never Smoker   . Smokeless tobacco: Not on file  . Alcohol Use: 0.0 - 0.5 oz/week    0-1 drink(s) per week  . Drug Use: Not on file  . Sexual Activity: Yes    Partners: Male    Birth Control/ Protection: Condom   Other Topics Concern  . Not on file   Social History Narrative   No regular exercise.     Family History  Problem Relation Age of Onset  . Diabetes Maternal Grandfather   . Stroke Maternal Grandfather   . Seizures Mother   . Hypertension Father   . Hypertension Mother     Outpatient Encounter Prescriptions as of 10/13/2014  Medication Sig  . hydrochlorothiazide (MICROZIDE) 12.5 MG capsule Take 1 capsule (12.5 mg total) by mouth daily.  . metoprolol succinate (TOPROL-XL) 25 MG 24 hr tablet Take 1 tablet (25 mg total)  by mouth daily.  . NOR-QD 0.35 MG tablet TAKE 1 TABLET BY MOUTH DAILY.          Objective:   Physical Exam  Constitutional: She is oriented to person, place, and time. She appears well-developed and well-nourished.  HENT:  Head: Normocephalic and atraumatic.  Cardiovascular: Normal rate, regular rhythm and normal heart sounds.   Pulmonary/Chest: Effort normal and breath sounds normal.  Musculoskeletal:  Tender just below the acromion on the left shoulder.. Left shoulder with normal range of motion. Negative empty can test. Rhonchus out of 5.  Neurological: She is alert and oriented to person, place, and time.  Skin: Skin is warm and dry.  Psychiatric: She has a normal mood and affect. Her behavior is normal.          Assessment & Plan:  HTN- usually she's having some side effects with the metoprolol such as fatigue et Ronney Asters. We'll switch her to this Bystolic. We'll treat with 10 mg daily. Followup in 2 weeks to recheck blood pressure and pulse.  Tachycardia - will switch to Bystolic 10 mg daily. Followup in 2 weeks recheck pulse and blood pressure.  Atypical chest  pain-will refer for treadmill stress test. EKG at last OV that was normal.   GERD - suspect that the burning sensation is most likely related to gastritis/GERD. Will put her on a PPI for the next 2 weeks to see if this resolves her symptoms. Will get handout on foods to avoid.

## 2014-10-31 ENCOUNTER — Telehealth: Payer: Self-pay | Admitting: *Deleted

## 2014-10-31 ENCOUNTER — Ambulatory Visit (INDEPENDENT_AMBULATORY_CARE_PROVIDER_SITE_OTHER): Payer: PRIVATE HEALTH INSURANCE | Admitting: Family Medicine

## 2014-10-31 ENCOUNTER — Encounter: Payer: Self-pay | Admitting: Family Medicine

## 2014-10-31 VITALS — BP 131/88 | HR 84 | Wt 208.0 lb

## 2014-10-31 DIAGNOSIS — R000 Tachycardia, unspecified: Secondary | ICD-10-CM

## 2014-10-31 DIAGNOSIS — I10 Essential (primary) hypertension: Secondary | ICD-10-CM | POA: Insufficient documentation

## 2014-10-31 DIAGNOSIS — K219 Gastro-esophageal reflux disease without esophagitis: Secondary | ICD-10-CM

## 2014-10-31 MED ORDER — NEBIVOLOL HCL 5 MG PO TABS: ORAL_TABLET | ORAL | Status: DC

## 2014-10-31 NOTE — Assessment & Plan Note (Signed)
Well controlled on bystolic  Cal if not covered by insruance or if too costly.

## 2014-10-31 NOTE — Assessment & Plan Note (Signed)
Much improved on Bystolic.  F/U in 3 months.  Coupon card given.

## 2014-10-31 NOTE — Telephone Encounter (Signed)
No prior auth required for exercise tolerance test. Margette Fast, CMA

## 2014-10-31 NOTE — Progress Notes (Signed)
   Subjective:    Patient ID: Misty Hodge, female    DOB: 1976/01/22, 38 y.o.   MRN: [id number]  HPI HTN f/u - we changed her to Bysotlic at last OV.Also on hctz. Says she feels more like herself.  Says her fatigue is much improved on this med compared to the metoprolol.   Tachycardia f/u - Much improved on teh bystolic.  Has been stable.   GERD f/u - Never started the PPI. Says her sxs have been better since stopping the metoprolol. Has been trying ot eat better as well.   Review of Systems     Objective:   Physical Exam  Constitutional: She is oriented to person, place, and time. She appears well-developed and well-nourished.  HENT:  Head: Normocephalic and atraumatic.  Cardiovascular: Normal rate, regular rhythm and normal heart sounds.   Pulmonary/Chest: Effort normal and breath sounds normal.  Neurological: She is alert and oriented to person, place, and time.  Skin: Skin is warm and dry.  Psychiatric: She has a normal mood and affect. Her behavior is normal.          Assessment & Plan:  GERD - symptoms are much improved. Not on a PPI.  Can certainly start medication if sxs start to recur.

## 2014-11-08 ENCOUNTER — Telehealth: Payer: Self-pay | Admitting: *Deleted

## 2014-11-08 NOTE — Telephone Encounter (Signed)
Pt called and stated that she was told that she will have to get a PA for the bystolic. Forwarded to [employer]. For f/u.Audelia Hives [place]

## 2014-11-10 ENCOUNTER — Other Ambulatory Visit: Payer: Self-pay

## 2014-11-10 MED ORDER — NEBIVOLOL HCL 5 MG PO TABS: ORAL_TABLET | ORAL | Status: DC

## 2014-11-15 ENCOUNTER — Encounter: Payer: PRIVATE HEALTH INSURANCE | Admitting: Family Medicine

## 2014-11-15 NOTE — Telephone Encounter (Signed)
Submitted for prior auth. Margette Fast, RMA

## 2014-11-18 ENCOUNTER — Encounter: Payer: PRIVATE HEALTH INSURANCE | Admitting: Cardiology

## 2014-11-30 ENCOUNTER — Encounter: Payer: PRIVATE HEALTH INSURANCE | Admitting: Family Medicine

## 2014-12-02 ENCOUNTER — Ambulatory Visit (INDEPENDENT_AMBULATORY_CARE_PROVIDER_SITE_OTHER): Payer: PRIVATE HEALTH INSURANCE | Admitting: Family Medicine

## 2014-12-02 ENCOUNTER — Encounter: Payer: Self-pay | Admitting: Family Medicine

## 2014-12-02 ENCOUNTER — Other Ambulatory Visit (HOSPITAL_COMMUNITY)
Admission: RE | Admit: 2014-12-02 | Discharge: 2014-12-02 | Disposition: A | Discharge status: Home / Self Care | Payer: PRIVATE HEALTH INSURANCE | Source: Ambulatory Visit | Attending: Family Medicine | Admitting: Family Medicine

## 2014-12-02 VITALS — BP 128/82

## 2014-12-02 DIAGNOSIS — Z01419 Encounter for gynecological examination (general) (routine) without abnormal findings: Secondary | ICD-10-CM

## 2014-12-02 DIAGNOSIS — N841 Polyp of cervix uteri: Secondary | ICD-10-CM

## 2014-12-02 DIAGNOSIS — R635 Abnormal weight gain: Secondary | ICD-10-CM

## 2014-12-02 DIAGNOSIS — Z1151 Encounter for screening for human papillomavirus (HPV): Secondary | ICD-10-CM | POA: Diagnosis present

## 2014-12-02 MED ORDER — NEBIVOLOL HCL 10 MG PO TABS: 10.0000 mg | ORAL_TABLET | Freq: Every day | ORAL | Status: DC

## 2014-12-02 MED ORDER — PHENTERMINE HCL 15 MG PO CAPS: 15.0000 mg | ORAL_CAPSULE | ORAL | Status: DC

## 2014-12-02 NOTE — Progress Notes (Signed)
Subjective:     Misty Hodge is a 38 y.o. female and is here for a comprehensive physical exam. The patient reports no problems.  History   Social History  . Marital Status: Single    Spouse Name: N/A    Number of Children: N/A  . Years of Education: N/A   Occupational History  . Not on file.   Social History Main Topics  . Smoking status: Never Smoker   . Smokeless tobacco: Not on file  . Alcohol Use: 0.0 - 0.5 oz/week    0-1 drink(s) per week  . Drug Use: Not on file  . Sexual Activity:    Partners: Male    Birth Control/ Protection: Condom   Other Topics Concern  . Not on file   Social History Narrative   No regular exercise.    Health Maintenance  Topic Date Due  . PAP SMEAR  11/12/2014  . INFLUENZA VACCINE  07/31/2015  . TETANUS/TDAP  04/20/2017    The following portions of the patient's history were reviewed and updated as appropriate: allergies, current medications, past family history, past medical history, past social history, past surgical history and problem list.  Review of Systems A comprehensive review of systems was negative.   Objective:    BP 128/82 mmHg General appearance: alert, cooperative and appears stated age Head: Normocephalic, without obvious abnormality, atraumatic Eyes: conhj clear, EOMI, PEERLA Ears: normal TM's and external ear canals both ears Nose: Nares normal. Septum midline. Mucosa normal. No drainage or sinus tenderness. Throat: lips, mucosa, and tongue normal; teeth and gums normal Neck: no adenopathy, no carotid bruit, no JVD, supple, symmetrical, trachea midline and thyroid not enlarged, symmetric, no tenderness/mass/nodules Back: symmetric, no curvature. ROM normal. No CVA tenderness. Lungs: clear to auscultation bilaterally Breasts: normal appearance, no masses or tenderness Heart: regular rate and rhythm, S1, S2 normal, no murmur, click, rub or gallop Abdomen: soft, non-tender; bowel sounds normal; no masses,   no organomegaly Pelvic: cervix normal in appearance, external genitalia normal, no adnexal masses or tenderness, no cervical motion tenderness, rectovaginal septum normal, uterus normal size, shape, and consistency, vagina normal without discharge and small polyp at the cervical opening Extremities: extremities normal, atraumatic, no cyanosis or edema Pulses: 2+ and symmetric Skin: Skin color, texture, turgor normal. No rashes or lesions Lymph nodes: Cervical, supraclavicular, and axillary nodes normal. Neurologic: Alert and oriented X 3, normal strength and tone. Normal symmetric reflexes. Normal coordination and gait    Assessment:    Healthy female exam.      Plan:     See After Visit Summary for Counseling Recommendations  complete physical examination Keep up a regular exercise program and make sure you are eating a healthy diet Try to eat 4 servings of dairy a day, or if you are lactose intolerant take a calcium with vitamin D daily.  Your vaccines are up to date.   Abnormal weight gain - we discussed different options. We'll try low-dose of phentermine. She will need to monitor carefully for palpitations and increasing her blood pressure. Sure he has a history of palpitations. If she expresses that she will need to stop immediately and call the office back. We could then consider one of the non-stimulants like Belviq. Encouraged her to start a regular exercise program in addition to really monitoring and watching her diet. Follow-up in one month for blood pressure and weight checks. She understands that she will have to be monitored monthly while taking the medication.  Cervical polyp-will refer to GYN for biopsy and removal.

## 2014-12-03 LAB — COMPLETE METABOLIC PANEL WITH GFR
ALBUMIN: 3.9 g/dL (ref 3.5–5.2)
ALT: 20 U/L (ref 0–35)
AST: 19 U/L (ref 0–37)
Alkaline Phosphatase: 51 U/L (ref 39–117)
BUN: 13 mg/dL (ref 6–23)
CALCIUM: 9.2 mg/dL (ref 8.4–10.5)
CHLORIDE: 104 meq/L (ref 96–112)
CO2: 19 mEq/L (ref 19–32)
Creat: 0.84 mg/dL (ref 0.50–1.10)
GFR, Est Non African American: >89 mL/min
GLUCOSE: 101 mg/dL — AB (ref 70–99)
Potassium: 4.4 mEq/L (ref 3.5–5.3)
Sodium: 138 mEq/L (ref 135–145)
Total Bilirubin: 0.6 mg/dL (ref 0.2–1.2)
Total Protein: 6.8 g/dL (ref 6.0–8.3)

## 2014-12-03 LAB — LIPID PANEL
CHOLESTEROL: 155 mg/dL (ref 0–200)
HDL: 56 mg/dL (ref >39)
LDL Cholesterol: 86 mg/dL (ref 0–99)
Total CHOL/HDL Ratio: 2.8 Ratio
Triglycerides: 63 mg/dL (ref <150)
VLDL: 13 mg/dL (ref 0–40)

## 2014-12-03 LAB — TSH: TSH: 0.612 u[IU]/mL (ref 0.350–4.500)

## 2014-12-05 LAB — CYTOLOGY - PAP

## 2014-12-07 ENCOUNTER — Telehealth: Payer: Self-pay

## 2014-12-07 NOTE — Telephone Encounter (Signed)
Left message for pt to call office to schedule appt. Received referral from Dr. Madilyn Fireman office.

## 2014-12-07 NOTE — Progress Notes (Signed)
Quick Note:  Call patient: Your Pap smear is normal. Repeat in 3 years. ______ 

## 2014-12-21 ENCOUNTER — Encounter: Payer: Self-pay | Admitting: Obstetrics & Gynecology

## 2014-12-21 ENCOUNTER — Ambulatory Visit (INDEPENDENT_AMBULATORY_CARE_PROVIDER_SITE_OTHER): Payer: PRIVATE HEALTH INSURANCE | Admitting: Obstetrics & Gynecology

## 2014-12-21 VITALS — BP 126/82 | HR 88 | Resp 16 | Ht 67.0 in | Wt 207.0 lb

## 2014-12-21 DIAGNOSIS — N841 Polyp of cervix uteri: Secondary | ICD-10-CM

## 2014-12-21 NOTE — Progress Notes (Signed)
   Subjective:    Patient ID: Misty Hodge, female    DOB: 01-12-1976, 38 y.o.   MRN: [id number]  HPI  38 yo lady is referred here for a cervical polyp.   Review of Systems     Objective:   Physical Exam  Nulliparous cervix. No polyp seen. I inserted uterine dressing forceps into the os and check for a polyp up inside the cervix, but none were found.      Assessment & Plan:  H/o cervical polyp- none present at this time. Reassurance given.

## 2014-12-30 NOTE — L&D Delivery Note (Signed)
Delivery Note Please see progress note entered at 0626 on 10/18/15 by Dr. Elly Modena for details of this induction of labor for IUFD.  At 8:20 PM a non-viable female was delivered via Vaginal, Spontaneous Delivery.  APGAR: 0, 0; weight 3.2 oz (91 g).   Placenta status: Manual removal around 0600 on 10/18/15 after further cytotec dosing.  Pathology.  Cord:  with the following complications: translucency.  Cord pH: N/A  Anesthesia: None  Episiotomy: None Lacerations: None Suture Repair: N/A Est. Blood Loss (mL):  minimal  Mom to antepartum.  Baby to couplet then morgue.  Darci Needle, MD [facility], PGY-1 10/18/2015, 9:07 AM

## 2015-03-13 ENCOUNTER — Other Ambulatory Visit: Payer: Self-pay | Admitting: *Deleted

## 2015-03-13 MED ORDER — NEBIVOLOL HCL 10 MG PO TABS: ORAL_TABLET | ORAL | Status: DC

## 2015-06-07 ENCOUNTER — Ambulatory Visit (INDEPENDENT_AMBULATORY_CARE_PROVIDER_SITE_OTHER): Payer: PRIVATE HEALTH INSURANCE | Admitting: Family Medicine

## 2015-06-07 ENCOUNTER — Encounter: Payer: Self-pay | Admitting: Family Medicine

## 2015-06-07 VITALS — BP 137/84 | HR 104 | Ht 67.0 in | Wt 212.0 lb

## 2015-06-07 DIAGNOSIS — R197 Diarrhea, unspecified: Secondary | ICD-10-CM

## 2015-06-07 DIAGNOSIS — R51 Headache: Secondary | ICD-10-CM | POA: Diagnosis not present

## 2015-06-07 DIAGNOSIS — R519 Headache, unspecified: Secondary | ICD-10-CM

## 2015-06-07 DIAGNOSIS — I10 Essential (primary) hypertension: Secondary | ICD-10-CM | POA: Diagnosis not present

## 2015-06-07 DIAGNOSIS — R21 Rash and other nonspecific skin eruption: Secondary | ICD-10-CM | POA: Diagnosis not present

## 2015-06-07 MED ORDER — HYDROCHLOROTHIAZIDE 12.5 MG PO CAPS: 12.5000 mg | ORAL_CAPSULE | Freq: Every day | ORAL | Status: DC

## 2015-06-07 MED ORDER — NEBIVOLOL HCL 10 MG PO TABS: 10.0000 mg | ORAL_TABLET | Freq: Every day | ORAL | Status: DC

## 2015-06-07 NOTE — Progress Notes (Signed)
   Subjective:    Patient ID: Misty Hodge, female    DOB: 12/11/1976, 39 y.o.   MRN: [id number]  HPI [place] She was bitten by mosquitos.  They did use Off bug repelant. No there sick family members.  She is now having diarrhea and rash.  Diarrhea started last week and only lasted 3 days. Doing the [employer].  +HA.  She has had sinus pressure. She is taking Sudafed.  Sunday was last episode of diarrhea. But stools are still soft. She had a rash as well on her calf, foot and forearm. Resolved now. Started an antihistamine and got better in 2 days. + runny nose. Clear.  She is worried about [place]. She and her husband havn't been suing protection.  Though not actively trying to get pregnant.   Hypertension- Pt denies chest pain, SOB, dizziness, or heart palpitations.  Taking meds as directed w/o problems.  Denies medication side effects.      Review of Systems     Objective:   Physical Exam  Constitutional: She is oriented to person, place, and time. She appears well-developed and well-nourished.  HENT:  Head: Normocephalic and atraumatic.  Right Ear: External ear normal.  Left Ear: External ear normal.  Nose: Nose normal.  Mouth/Throat: Oropharynx is clear and moist.  TMs and canals are clear.   Eyes: Conjunctivae and EOM are normal. Pupils are equal, round, and reactive to light.  Neck: Neck supple. No thyromegaly present.  Cardiovascular: Normal rate, regular rhythm and normal heart sounds.   Pulmonary/Chest: Effort normal and breath sounds normal. She has no wheezes.  Abdominal: Soft. Bowel sounds are normal. She exhibits no distension and no mass. There is no tenderness. There is no rebound and no guarding.  Lymphadenopathy:    She has no cervical adenopathy.  Neurological: She is alert and oriented to person, place, and time.  Skin: Skin is warm and dry.  No jaundice.   Psychiatric: She has a normal mood and affect.          Assessment &  Plan:  Sinus HA- ok to continue the decongestant.  Call if not better next week or if sxs gets worse like a sinus infection.   Diarrhea - resolved.  Suspect traveler's diarrhea.   Rash - resolved.    If pregnancy occurs then call immmediatly.  She ans her husband plan on using protection for the next 2 months just incase exposed to [place].    HTN - well controlled. F/U in 6 months. RF sent to pharmacy

## 2015-06-19 ENCOUNTER — Telehealth: Payer: Self-pay | Admitting: *Deleted

## 2015-06-19 DIAGNOSIS — O0991 Supervision of high risk pregnancy, unspecified, first trimester: Secondary | ICD-10-CM

## 2015-06-19 NOTE — Telephone Encounter (Signed)
Pt called and stated that she took an at home pregnancy test and it is positive. She would like for Dr. Madilyn Fireman to change her BP meds because of this and will f/u with her to confirm this. Will fwd to pcp for f/u.Marland KitchenElouise Munroe'

## 2015-06-20 ENCOUNTER — Other Ambulatory Visit: Payer: Self-pay | Admitting: *Deleted

## 2015-06-20 MED ORDER — HYDROCHLOROTHIAZIDE 12.5 MG PO CAPS: 12.5000 mg | ORAL_CAPSULE | Freq: Every day | ORAL | Status: DC

## 2015-06-20 NOTE — Telephone Encounter (Signed)
Referral placed.

## 2015-06-20 NOTE — Telephone Encounter (Signed)
Pt informed of recommendations. She does not have an OB she feels that she will need a hi-risk OB due to age and being hypertensive. [facility] Pt has been taking her prenatals since January.Misty Hodge, [place] Crocker

## 2015-06-20 NOTE — Telephone Encounter (Signed)
I checked safety data and she will be able to continue both for now.  See if needs referal for OB. Needs to be on prenatal vitamin.

## 2015-07-14 ENCOUNTER — Encounter: Payer: PRIVATE HEALTH INSURANCE | Admitting: Advanced Practice Midwife

## 2015-07-18 ENCOUNTER — Encounter: Payer: Self-pay | Admitting: Obstetrics & Gynecology

## 2015-07-18 ENCOUNTER — Ambulatory Visit (INDEPENDENT_AMBULATORY_CARE_PROVIDER_SITE_OTHER): Payer: PRIVATE HEALTH INSURANCE | Admitting: Obstetrics & Gynecology

## 2015-07-18 VITALS — BP 134/88 | HR 88 | Wt 218.0 lb

## 2015-07-18 DIAGNOSIS — O10911 Unspecified pre-existing hypertension complicating pregnancy, first trimester: Secondary | ICD-10-CM

## 2015-07-18 DIAGNOSIS — Z3481 Encounter for supervision of other normal pregnancy, first trimester: Secondary | ICD-10-CM | POA: Diagnosis not present

## 2015-07-18 DIAGNOSIS — A925 Zika virus disease: Secondary | ICD-10-CM

## 2015-07-18 DIAGNOSIS — Z3490 Encounter for supervision of normal pregnancy, unspecified, unspecified trimester: Secondary | ICD-10-CM

## 2015-07-18 DIAGNOSIS — A928 Other specified mosquito-borne viral fevers: Secondary | ICD-10-CM

## 2015-07-18 MED ORDER — LABETALOL HCL 100 MG PO TABS: 200.0000 mg | ORAL_TABLET | Freq: Two times a day (BID) | ORAL | Status: DC

## 2015-07-18 NOTE — Progress Notes (Signed)
Bedside U/S shows IUP with FHT of 178 BPM  CRL measures 24.60mm GA [redacted]w[redacted]d

## 2015-07-19 LAB — OBSTETRIC PANEL
Antibody Screen: NEGATIVE
Basophils Absolute: 0 10*3/uL (ref 0.0–0.1)
Basophils Relative: 0 % (ref 0–1)
Eosinophils Absolute: 0.1 10*3/uL (ref 0.0–0.7)
Eosinophils Relative: 1 % (ref 0–5)
HEMATOCRIT: 40.2 % (ref 36.0–46.0)
HEMOGLOBIN: 13.3 g/dL (ref 12.0–15.0)
Hepatitis B Surface Ag: NEGATIVE
LYMPHS ABS: 3 10*3/uL (ref 0.7–4.0)
LYMPHS PCT: 37 % (ref 12–46)
MCH: 29.4 pg (ref 26.0–34.0)
MCHC: 33.1 g/dL (ref 30.0–36.0)
MCV: 88.9 fL (ref 78.0–100.0)
MPV: 9 fL (ref 8.6–12.4)
Monocytes Absolute: 0.3 10*3/uL (ref 0.1–1.0)
Monocytes Relative: 4 % (ref 3–12)
Neutro Abs: 4.6 10*3/uL (ref 1.7–7.7)
Neutrophils Relative %: 58 % (ref 43–77)
Platelets: 262 10*3/uL (ref 150–400)
RBC: 4.52 MIL/uL (ref 3.87–5.11)
RDW: 13.3 % (ref 11.5–15.5)
Rh Type: POSITIVE
Rubella: 3.68 Index — ABNORMAL HIGH (ref <0.90)
WBC: 8 10*3/uL (ref 4.0–10.5)

## 2015-07-19 LAB — HIV ANTIBODY (ROUTINE TESTING W REFLEX): HIV 1&2 Ab, 4th Generation: NONREACTIVE

## 2015-07-19 LAB — GC/CHLAMYDIA PROBE AMP, URINE
Chlamydia, Swab/Urine, PCR: NEGATIVE
GC PROBE AMP, URINE: NEGATIVE

## 2015-07-19 LAB — SICKLE CELL SCREEN: Sickle Cell Screen: NEGATIVE

## 2015-07-20 ENCOUNTER — Encounter: Payer: Self-pay | Admitting: Obstetrics & Gynecology

## 2015-07-20 DIAGNOSIS — O10919 Unspecified pre-existing hypertension complicating pregnancy, unspecified trimester: Secondary | ICD-10-CM | POA: Insufficient documentation

## 2015-07-20 LAB — CYSTIC FIBROSIS DIAGNOSTIC STUDY

## 2015-07-20 LAB — CULTURE, URINE COMPREHENSIVE
Colony Count: NO GROWTH
Organism ID, Bacteria: NO GROWTH

## 2015-07-20 NOTE — Progress Notes (Signed)
   Subjective:    Misty Hodge is a G2P0010 [redacted]w[redacted]d being seen today for her first obstetrical visit.  Her obstetrical history is significant for advanced maternal age and 55. Patient does intend to breast feed. Pregnancy history fully reviewed.  Patient reports fatigue and nausea.  Filed Vitals:   07/18/15 1455  BP: 134/88  Pulse: 88  Weight: 218 lb (98.884 kg)    HISTORY: OB History  Gravida Para Term Preterm AB SAB TAB Ectopic Multiple Living  2 0   1  1   0    # Outcome Date GA Lbr Len/2nd Weight Sex Delivery Anes PTL Lv  2 Current           1 TAB              Past Medical History  Diagnosis Date  . Boil, groin     recurrent of RT  . Fibroadenoma   . Vaginal Pap smear, abnormal   . Hypertension    Past Surgical History  Procedure Laterality Date  . Lt breast solid or complex nodule    . Lt breast needle biopsy     Family History  Problem Relation Age of Onset  . Diabetes Maternal Grandfather   . Stroke Maternal Grandfather   . Seizures Mother   . Hypertension Mother   . Hypertension Father   . Cancer Neg Hx      Exam    Uterus:     Pelvic Exam:    Perineum: No Hemorrhoids   Vulva: Bartholin's, Urethra, Skene's normal   Vagina:  normal mucosa   pH: n/a   Cervix: no lesions and nulliparous appearance   Adnexa: no mass, fullness, tenderness   Bony Pelvis: average  System: Breast:  normal appearance, no masses or tenderness   Skin: normal coloration and turgor, no rashes    Neurologic: oriented, normal, normal mood   Extremities: normal strength, tone, and muscle mass   HEENT sclera clear, anicteric, oropharynx clear, no lesions, neck supple with midline trachea, thyroid without masses and trachea midline   Mouth/Teeth mucous membranes moist, pharynx normal without lesions and dental hygiene good   Neck supple and no masses   Cardiovascular: regular rate and rhythm   Respiratory:  appears well, vitals normal, no respiratory distress,  acyanotic, normal RR, chest clear, no wheezing, crepitations, rhonchi, normal symmetric air entry   Abdomen: soft, non-tender; bowel sounds normal; no masses,  no organomegaly   Urinary: urethral meatus normal      Assessment:    Pregnancy: G2P0010 Patient Active Problem List   Diagnosis Date Noted  . Essential hypertension 10/31/2014  . Obesity (BMI 30-39.9) 10/05/2014  . WEIGHT GAIN 09/28/2009  . HEADACHE, TENSION 08/29/2008  . Tachycardia 03/30/2008  . VENEREAL WART 04/21/2007        Plan:     Initial labs drawn. Prenatal vitamins. Problem list reviewed and updated. Genetic Screening discussed--undecided on testing.  Wants to meet with MFM about Zika exposure around time of conception.  Ultrasound discussed; fetal survey: requested.  CHTN--Baseline labs, TSH, ASA at 12 weeks; Stop current beta blocker and start labetalol 100 mg bid.  RTC 2 days for BP check. AMA--genetics offerered, Early GCT Zika Exposure--early [place] and consult with MFM  Misty Hodge H. 07/20/2015

## 2015-07-21 ENCOUNTER — Telehealth: Payer: Self-pay

## 2015-07-21 ENCOUNTER — Telehealth: Payer: Self-pay | Admitting: *Deleted

## 2015-07-21 NOTE — Telephone Encounter (Signed)
BP well controlled on Labetalol

## 2015-07-21 NOTE — Telephone Encounter (Signed)
Pt was told to call in to report her BP after starting the labetalol.BP 127/82 Pulse 88.

## 2015-07-21 NOTE — Telephone Encounter (Signed)
Erroneous

## 2015-08-17 ENCOUNTER — Ambulatory Visit (HOSPITAL_COMMUNITY)
Admission: RE | Admit: 2015-08-17 | Discharge: 2015-08-17 | Disposition: A | Discharge status: Home / Self Care | Payer: PRIVATE HEALTH INSURANCE | Source: Ambulatory Visit | Attending: Obstetrics & Gynecology | Admitting: Obstetrics & Gynecology

## 2015-08-17 ENCOUNTER — Encounter (HOSPITAL_COMMUNITY): Payer: Self-pay

## 2015-08-17 ENCOUNTER — Other Ambulatory Visit: Payer: Self-pay | Admitting: Obstetrics & Gynecology

## 2015-08-17 VITALS — BP 124/78 | HR 92 | Wt 217.2 lb

## 2015-08-17 DIAGNOSIS — Z369 Encounter for antenatal screening, unspecified: Secondary | ICD-10-CM

## 2015-08-17 DIAGNOSIS — Z3A13 13 weeks gestation of pregnancy: Secondary | ICD-10-CM | POA: Insufficient documentation

## 2015-08-17 DIAGNOSIS — Z315 Encounter for genetic counseling: Secondary | ICD-10-CM | POA: Diagnosis not present

## 2015-08-17 DIAGNOSIS — Z3689 Encounter for other specified antenatal screening: Secondary | ICD-10-CM

## 2015-08-17 DIAGNOSIS — O09511 Supervision of elderly primigravida, first trimester: Secondary | ICD-10-CM

## 2015-08-17 DIAGNOSIS — O09519 Supervision of elderly primigravida, unspecified trimester: Secondary | ICD-10-CM | POA: Insufficient documentation

## 2015-08-17 DIAGNOSIS — O10919 Unspecified pre-existing hypertension complicating pregnancy, unspecified trimester: Secondary | ICD-10-CM

## 2015-08-17 DIAGNOSIS — O09521 Supervision of elderly multigravida, first trimester: Secondary | ICD-10-CM | POA: Diagnosis not present

## 2015-08-17 NOTE — Progress Notes (Addendum)
Genetic Counseling  High-Risk Gestation Note  Appointment Date:  08/17/2015 Referred By: Guss Bunde, MD Date of Birth:  1976-02-24 Partner:  Emelia Salisbury   Pregnancy History: [id number] Estimated Date of Delivery: 02/20/16 Estimated Gestational Age: [redacted]w[redacted]d Attending: Jolyn Lent, MD  Mrs. Junie Bame and her husband, Mr. Florene Route. Ebony Hail, IV, were seen for genetic counseling because of a maternal age of 39 y.o..   She will be 39 years old at delivery.   In Summary:  Advanced maternal age  Patient elected to pursue first trimester screening today. She may consider NIPS, pending results of first screen  Patient declined amniocentesis  Family history positive for intellectual disability and sickle cell anemia; See family history section below for detailed discussion.   The couple traveled to the [place] 66[place] and asked about possible Zika virus exposure; See separate note from Dr. Nelda Severe in ultrasound report regarding today's discussion of possible exposure  They were counseled regarding maternal age and the association with risk for chromosome conditions due to nondisjunction with aging of the ova.   We reviewed chromosomes, nondisjunction, and the associated 1 in 27 risk for fetal aneuploidy at [redacted]w[redacted]d gestation related to a maternal age of 39 years old at delivery.  they were counseled that the risk for aneuploidy decreases as gestational age increases, accounting for those pregnancies which spontaneously abort.  We specifically discussed Down syndrome (trisomy 28), trisomies 63 and 88, and sex chromosome aneuploidies (47,XXX and 47,XXY) including the common features and prognoses of each.   We reviewed available screening options including First Screen, Quad screen, noninvasive prenatal screening (NIPS)/cell free DNA (cfDNA) testing, and detailed ultrasound.  They were counseled that screening tests are used to modify a patient's a priori risk for aneuploidy,  typically based on age. This estimate provides a pregnancy specific risk assessment. We reviewed the benefits and limitations of each option. Specifically, we discussed the conditions for which each test screens, the detection rates, and false positive rates of each. They were also counseled regarding diagnostic testing via [facility] and amniocentesis. We reviewed the approximate 1 in 301-601 risk for complications for amniocentesis, including spontaneous pregnancy loss. After consideration of all the options, she elected to proceed with first trimester screening today. Ms. Roat was concerned about potential out of pocket costs for NIPS and thus, declined this at the time of today's visit. She may further consider NIPS pending results of first trimester screening.   She declined invasive testing in the pregnancy.   The ultrasound report from today's nuchal translucency ultrasound will be documented separately.  Detailed ultrasound is scheduled for 09/21/15.  They understand that screening tests cannot rule out all birth defects or genetic syndromes. The patient was advised of this limitation and states she still does not want additional testing at this time.   Mrs. Alina Rennie Hack was provided with written information regarding sickle cell anemia (SCA) including the carrier frequency and incidence in the African-American population, the availability of carrier testing and prenatal diagnosis if indicated.  In addition, we discussed that hemoglobinopathies are routinely screened for as part of the [place] newborn screening panel.  OB medical records indicate that she had normal hemoglobin S screening, indicating that she does not likely have hemoglobin S (sickle cell trait). We discussed that screening for additional, less common hemoglobin variants, such as hemoglobin C is available via hemoglobin electrophoresis, if desired. Ms. Newlun declined hemoglobin electrophoresis today.   Both family histories were  reviewed and found  to be contributory for mild intellectual disability for Mr. Worlds's maternal half-sister. She lives independently and has a daughter, who reportedly does not have intellectual disability. Mr. Laflamme described no apparent dysmorphic features for his half-sister. He reported that her mental disability is attributed to her being the product of incest. We discussed that offspring of first degree relatives have an increased risk for mental and/or physical disabilities given the increased risk for autosomal recessive and multifactorial conditions.  Without further information regarding the provided family history, an accurate genetic risk cannot be calculated. Further genetic counseling is warranted if more information is obtained. We discussed that given this reported family history, recurrence risk for the current pregnancy would likely be low but possibly increased above the general population risk. In the absence of an identified genetic etiology, prenatal screening or testing would not likely be informative regarding the reported intellectual disability in the family.   Mr. Furniss reported that his paternal aunt had sickle cell anemia, and his dad had sickle cell trait. Mr. Freeman reports that he does not have sickle cell trait to his knowledge, but is unsure of being screened in the past. We discussed that if he has not had screening, he would have a 1 in 2 (50%) chance to also have sickle cell trait. We reviewed the option of hemoglobin electrophoresis to screen for sickle cell trait and other hemoglobin variants, if desired. He declined screening at this time. The family histories were otherwise unremarkable for birth defects, intellectual disabilities, or known genetic conditions.   Mrs. Folz denied exposure to environmental toxins or chemical agents. She denied the use of alcohol, tobacco or street drugs. The couple reported travelling to the [place] May 99[place]77, which  would have been just prior to conception of the current pregnancy. They inquired about possible Zika virus exposure. Given the timing of the possible exposure, risk for possible effects in the current pregnancy is likely low. However, serum screening is available to the patient to assess for Zika virus. See separate note from Dr. Nelda Severe regarding today's discussion.  Her medical and surgical histories were contributory for hypertension, for which is treated with labetalol.   I counseled this couple regarding the above risks and available options.  The approximate face-to-face time with the genetic counselor was 45 minutes.  Chipper Oman, MS,  Certified Genetic Counselor 08/17/2015

## 2015-08-18 ENCOUNTER — Ambulatory Visit (INDEPENDENT_AMBULATORY_CARE_PROVIDER_SITE_OTHER): Payer: PRIVATE HEALTH INSURANCE | Admitting: Advanced Practice Midwife

## 2015-08-18 VITALS — BP 127/83 | HR 101 | Temp 97.2°F | Wt 214.0 lb

## 2015-08-18 DIAGNOSIS — O99210 Obesity complicating pregnancy, unspecified trimester: Secondary | ICD-10-CM

## 2015-08-18 DIAGNOSIS — Z36 Encounter for antenatal screening of mother: Secondary | ICD-10-CM

## 2015-08-18 DIAGNOSIS — O10911 Unspecified pre-existing hypertension complicating pregnancy, first trimester: Secondary | ICD-10-CM

## 2015-08-18 MED ORDER — LABETALOL HCL 100 MG PO TABS: 100.0000 mg | ORAL_TABLET | Freq: Two times a day (BID) | ORAL | Status: DC

## 2015-08-18 NOTE — Progress Notes (Signed)
Subjective:  Misty Hodge is a 39 y.o. G2P0010 at [redacted]w[redacted]d being seen today for ongoing prenatal care.  Patient reports no complaints.  Contractions: Not present.  Vag. Bleeding: None. Movement: Absent. Denies leaking of fluid.   The following portions of the patient's history were reviewed and updated as appropriate: allergies, current medications, past family history, past medical history, past social history, past surgical history and problem list.   Objective:   Filed Vitals:   08/18/15 1003  BP: 136/83  Pulse: 108  Temp: 97.2 F (36.2 C)  Weight: 214 lb (97.07 kg)    Fetal Status: Fetal Heart Rate (bpm): 160   Movement: Absent     General:  Alert, oriented and cooperative. Patient is in no acute distress.  Skin: Skin is warm and dry. No rash noted.   Cardiovascular: Normal heart rate noted  Respiratory: Normal respiratory effort, no problems with respiration noted  Abdomen: Soft, gravid, appropriate for gestational age. Pain/Pressure: Absent     Pelvic: Vag. Bleeding: None Vag D/C Character: Thin   Cervical exam deferred        Extremities: Normal range of motion.  Edema: None  Mental Status: Normal mood and affect. Normal behavior. Normal judgment and thought content.   Urinalysis: Urine Protein: Negative Urine Glucose: Negative  Assessment and Plan:  Pregnancy: G2P0010 at [redacted]w[redacted]d  1. Obesity in pregnancy  - Glucose Tolerance, 1 HR (50g)  2. Chronic hypertension in pregnancy, first trimester Labetalol 100 BID Baseline 24 hour urine  Preterm labor symptoms and general obstetric precautions including but not limited to vaginal bleeding, contractions, leaking of fluid and fetal movement were reviewed in detail with the patient. Please refer to After Visit Summary for other counseling recommendations.  4 weeks   [place], [place]

## 2015-08-18 NOTE — Progress Notes (Signed)
Early 1 hr GTT

## 2015-08-18 NOTE — Patient Instructions (Addendum)
24-Hour Urine Collection HOME CARE  When you get up in the morning on the day you do this test, pee (urinate) in the toilet and flush. Make a note of the time. This will be your start time on the day of collection and the end time on the next morning.  From then on, save all your pee (urine) in the plastic jug that was given to you.  You should stop collecting your pee 24 hours after you started.  If the plastic jug that is given to you already has liquid in it, that is okay. Do not throw out the liquid or rinse out the jug. Some tests need the liquid to be added to your pee.  Keep your plastic jug cool (in an ice chest or the refrigerator) during the test.  When the 24 hours is over, bring your plastic jug to the clinic lab. Keep the jug cool (in an ice chest) while you are bringing it to the lab. Document Released: 03/14/2009 Document Revised: 03/09/2012 Document Reviewed: 03/14/2009 [facility] Patient Information 2015 [place], [place]. This information is not intended to replace advice given to you by your health care provider. Make sure you discuss any questions you have with your health care provider.  Urine Protein Test Your blood contains proteins. When your blood is filtered by your kidneys, most of the proteins should be returned to your blood. Therefore, if you have a high level of protein in your urine (proteinuria), this could mean that your kidneys are not working as well as they should be.  Most people have this test to screen for kidney disease. The test may be done if you have a condition that affects your kidneys, such as:  Diabetes.  Urinary tract infection.  Complications of pregnancy. Your health care provider may also do this test if you have symptoms of kidney disease. These can include:  Fluid retention (edema).  Fatigue.  Nausea. This test requires a urine sample. Two methods may be used for the test:  A dipstick test is often the first step in a urine protein  test. After you urinate into a sterile cup, your health care provider dips a special test strip into your sample. The health care provider can tell right away if there is too much protein.  If the dipstick test indicates that there is too much protein in your urine, your health care provider may do a 24-hour urine test. This test measures the amount of protein that is released in your urine over a 24-hour period. You may also have other tests to find out the types of proteins you have in your urine. PREPARATION FOR TEST For the dipstick test:  Do not urinate for about an hour before the test.  Drink a glass of water about 20 minutes before the test. If you are having a 24-hour urine test:  Your health care provider will give you a sterile container to collect all the urine you produce over 24 hours (24-hour sample).  Follow the instructions carefully.  Keep the sample refrigerated until you return it. Before either test, let your health care provider know if:  You have any health conditions. Some conditions can cause you to have protein in your urine.  You are taking any medicines. Some medicines can cause you to have protein in your urine.  You have had a radiology scan with contrast dye.  You have exercised heavily in the last few days. RESULTS It is your responsibility to obtain your  test results. Ask the lab or department performing the test when and how you will get your results. Contact your health care provider to discuss any questions you have about your results.  Range of Normal Values Ranges for normal values may vary among different labs and hospitals. You should always check with your health care provider after having lab work or other tests done to discuss whether your values are considered within normal limits. The result of the urine dipstick test for proteins in your urine will be positive or negative.  A positive result means an excessive amount of protein was found  in your urine.  A negative result means a slight to moderate amount of protein was found in your urine. The result of the 24-hour urine test will be a range of values. Protein in the urine can be measured in milligrams (mg) per 24 hours. A normal value is less than 80 mg per 24 hours. Meaning of Results Outside Normal Value Ranges Many conditions can cause a level of protein in your urine that is higher than normal. Some common causes of proteinuria include:  Kidney disease.  Diabetes.  Bladder cancer.  High blood pressure.  Urinary tract infection.  Heart failure.  Toxemia of pregnancy.  Dehydration. Discuss your results with your health care provider. You may need to have more tests done to get a diagnosis. A protein level that is lower than normal is not a cause for concern. Document Released: 01/10/2005 Document Revised: 05/02/2014 Document Reviewed: 03/28/2014 [facility] Patient Information 2015 [place], [place]. This information is not intended to replace advice given to you by your health care provider. Make sure you discuss any questions you have with your health care provider.

## 2015-08-19 LAB — GLUCOSE TOLERANCE, 1 HOUR (50G) W/O FASTING: Glucose, 1 Hour GTT: 116 mg/dL (ref 70–140)

## 2015-08-20 ENCOUNTER — Encounter: Payer: Self-pay | Admitting: Obstetrics & Gynecology

## 2015-08-20 DIAGNOSIS — A925 Zika virus disease: Secondary | ICD-10-CM | POA: Insufficient documentation

## 2015-08-21 ENCOUNTER — Other Ambulatory Visit: Payer: PRIVATE HEALTH INSURANCE

## 2015-08-21 ENCOUNTER — Telehealth: Payer: Self-pay | Admitting: *Deleted

## 2015-08-21 NOTE — Telephone Encounter (Signed)
-----   Message from Guss Bunde, MD sent at 08/20/2015  5:05 PM EDT ----- Patient needs testing for Zika.  RN to coordinate.

## 2015-08-21 NOTE — Telephone Encounter (Signed)
Pt notified of normal 1 hr GTT and will return to office today for [facility] labs.

## 2015-08-22 ENCOUNTER — Telehealth (HOSPITAL_COMMUNITY): Payer: Self-pay | Admitting: MS"

## 2015-08-22 ENCOUNTER — Encounter: Payer: Self-pay | Admitting: *Deleted

## 2015-08-22 NOTE — Telephone Encounter (Signed)
Called message regarding first trimester screen results, which are within normal limits. Left message for patient to return call.   Misty Hodge 08/22/2015 4:13 PM

## 2015-08-22 NOTE — Telephone Encounter (Signed)
Called Misty Hodge regarding results of first trimester screening, which are within normal range for the conditions screened. Patient identified by name and DOB. Discussed that this screen reduced the risks for fetal Down syndrome (1 in 185 to 1 in 2,344) and fetal Trisomy 18/13 (1 in 359 to less than 1 in 10,000). Patient understands that this is not diagnostic for these conditions and did not assess for all chromosome conditions.   Discussed that the nuchal translucency measurement was reported to be at the 95th percentile, which as at the upper limits of normal. We briefly discussed the various common etiologies for an increased NT including: aneuploidy, single gene conditions, cardiac or great vessel abnormalities, lymphatic system failure, decreased fetal movement, fetal anemia, and normal variation.  Detailed ultrasound is scheduled for 09/21/15. Ms. Sano did not have additional questions at this time.   Santiago Glad Rayane Gallardo 08/22/2015 4:47 PM

## 2015-08-25 ENCOUNTER — Telehealth: Payer: Self-pay | Admitting: *Deleted

## 2015-08-25 LAB — ZIKA VIRUS RNA,QUAL RT-PCR: Zika Virus RNA,Qual RT-PCR: NOT DETECTED

## 2015-08-25 NOTE — Telephone Encounter (Signed)
LM on voicemail that her Zika test was neg.

## 2015-09-08 ENCOUNTER — Other Ambulatory Visit: Payer: PRIVATE HEALTH INSURANCE

## 2015-09-08 DIAGNOSIS — I10 Essential (primary) hypertension: Secondary | ICD-10-CM

## 2015-09-08 LAB — CBC
HEMATOCRIT: 36.7 % (ref 36.0–46.0)
Hemoglobin: 12.6 g/dL (ref 12.0–15.0)
MCH: 30 pg (ref 26.0–34.0)
MCHC: 34.3 g/dL (ref 30.0–36.0)
MCV: 87.4 fL (ref 78.0–100.0)
MPV: 9.6 fL (ref 8.6–12.4)
PLATELETS: 288 10*3/uL (ref 150–400)
RBC: 4.2 MIL/uL (ref 3.87–5.11)
RDW: 14 % (ref 11.5–15.5)
WBC: 7 10*3/uL (ref 4.0–10.5)

## 2015-09-09 LAB — PROTEIN, URINE, 24 HOUR
Protein, 24H Urine: 154 mg/d — ABNORMAL HIGH (ref <150)
Protein, Urine: 7 mg/dL (ref 5–24)

## 2015-09-09 LAB — COMPREHENSIVE METABOLIC PANEL
ALBUMIN: 3.2 g/dL — AB (ref 3.6–5.1)
ALK PHOS: 52 U/L (ref 33–115)
ALT: 61 U/L — AB (ref 6–29)
AST: 35 U/L — ABNORMAL HIGH (ref 10–30)
BUN: 11 mg/dL (ref 7–25)
CHLORIDE: 103 mmol/L (ref 98–110)
CO2: 21 mmol/L (ref 20–31)
CREATININE: 0.58 mg/dL (ref 0.50–1.10)
Calcium: 8.9 mg/dL (ref 8.6–10.2)
Glucose, Bld: 91 mg/dL (ref 65–99)
Potassium: 4 mmol/L (ref 3.5–5.3)
SODIUM: 135 mmol/L (ref 135–146)
TOTAL PROTEIN: 6 g/dL — AB (ref 6.1–8.1)
Total Bilirubin: 0.4 mg/dL (ref 0.2–1.2)

## 2015-09-09 LAB — CREATININE CLEARANCE, URINE, 24 HOUR
CREATININE 24H UR: 1630 mg/d (ref 700–1800)
CREATININE, URINE: 74.1 mg/dL
Creatinine Clearance: 195 mL/min — ABNORMAL HIGH (ref 75–115)
Creatinine: 0.58 mg/dL (ref 0.50–1.10)

## 2015-09-15 ENCOUNTER — Telehealth: Payer: Self-pay

## 2015-09-15 ENCOUNTER — Encounter: Payer: Self-pay | Admitting: Obstetrics & Gynecology

## 2015-09-15 DIAGNOSIS — R7401 Elevation of levels of liver transaminase levels: Secondary | ICD-10-CM | POA: Insufficient documentation

## 2015-09-15 DIAGNOSIS — R740 Nonspecific elevation of levels of transaminase and lactic acid dehydrogenase [LDH]: Secondary | ICD-10-CM

## 2015-09-15 NOTE — Telephone Encounter (Signed)
-----   Message from Guss Bunde, MD sent at 09/15/2015  6:25 AM EDT ----- LFTS mildly elevated.  Rpt in 7-10 days

## 2015-09-15 NOTE — Telephone Encounter (Signed)
Patient called back and made aware of mildly elevated LFTs and made aware that we need to repeat the 24 hour urine in next week. Patient will get supplies on Monday and turn in urine collection on Friday 09-22-15. Kathrene Alu RN BSN

## 2015-09-15 NOTE — Telephone Encounter (Signed)
Left message for patient to return call to office- need to schedule for repeat 24 hr urine. Kathrene Alu RN BSN

## 2015-09-18 ENCOUNTER — Ambulatory Visit (INDEPENDENT_AMBULATORY_CARE_PROVIDER_SITE_OTHER): Payer: PRIVATE HEALTH INSURANCE | Admitting: Advanced Practice Midwife

## 2015-09-18 VITALS — BP 143/91 | HR 83 | Wt 226.0 lb

## 2015-09-18 DIAGNOSIS — O10919 Unspecified pre-existing hypertension complicating pregnancy, unspecified trimester: Secondary | ICD-10-CM

## 2015-09-18 DIAGNOSIS — Z3482 Encounter for supervision of other normal pregnancy, second trimester: Secondary | ICD-10-CM

## 2015-09-18 MED ORDER — FUTURO KNEE HI SHEER HOSE MISC: 1.0000 | Freq: Every day | Status: DC

## 2015-09-18 MED ORDER — LABETALOL HCL 100 MG PO TABS: 100.0000 mg | ORAL_TABLET | Freq: Three times a day (TID) | ORAL | Status: DC

## 2015-09-18 MED ORDER — ASPIRIN EC 81 MG PO TBEC: 81.0000 mg | DELAYED_RELEASE_TABLET | Freq: Every day | ORAL | Status: DC

## 2015-09-18 NOTE — Patient Instructions (Signed)

## 2015-09-18 NOTE — Progress Notes (Signed)
Subjective:  Misty Hodge is a 39 y.o. G2P0010 at 97w6dbeing seen today for ongoing prenatal care.  Patient reports pedal edema.  Contractions: Not present.  Vag. Bleeding: None. Movement: Absent. Denies leaking of fluid.   The following portions of the patient's history were reviewed and updated as appropriate: allergies, current medications, past family history, past medical history, past social history, past surgical history and problem list.   Liver enzymes mildly elevated 09/08/15. Need to recheck.    Denies HA, vision changes or epigastric pain.  Objective:   Filed Vitals:   09/18/15 0940 09/18/15 1032  BP: 144/88 143/91  Pulse: 83   Weight: 226 lb (102.513 kg)     Fetal Status: Fetal Heart Rate (bpm): 147   Movement: Absent     General:  Alert, oriented and cooperative. Patient is in no acute distress.  Skin: Skin is warm and dry. No rash noted.   Cardiovascular: Normal heart rate noted  Respiratory: Normal respiratory effort, no problems with respiration noted  Abdomen: Soft, gravid, appropriate for gestational age. Pain/Pressure: Absent     Pelvic: Vag. Bleeding: None Vag D/C Character: Thin   Cervical exam deferred        Extremities: Normal range of motion.  Edema: Trace  Mental Status: Normal mood and affect. Normal behavior. Normal judgment and thought content.   Urinalysis: Urine Protein: Negative Urine Glucose: Negative  Assessment and Plan:  Pregnancy: G2P0010 at 173w6d1. Chronic hypertension in pregnancy, unspecified trimester  - Creatinine Clearance, Urine, 24 hour - Protein, Urine, 24 hour - CBC - Comp Met (CMET) - aspirin EC 81 MG tablet; Take 1 tablet (81 mg total) by mouth daily. Take after 12 weeks for prevention of preeclampssia later in pregnancy  Dispense: 300 tablet; Refill: 2 - Elastic Bandages & Supports (FUTURO KNEE HI SHEER HOSE) MISC; 1 Device by Does not apply route daily.  Dispense: 1 each; Refill: 1 - Increase Labetalol to 100 mg  TID.   Preterm labor symptoms and general obstetric precautions including but not limited to vaginal bleeding, contractions, leaking of fluid and fetal movement were reviewed in detail with the patient. Please refer to After Visit Summary for other counseling recommendations.  BP check in 2 weeks F/U ROB in 4 weeks Has anatomy scan scheduled 9/22 24 hour urine and repeat liver enzymes 9/26   [place]a [place]

## 2015-09-21 ENCOUNTER — Encounter (HOSPITAL_COMMUNITY): Payer: Self-pay

## 2015-09-21 ENCOUNTER — Ambulatory Visit (HOSPITAL_COMMUNITY)
Admission: RE | Admit: 2015-09-21 | Discharge: 2015-09-21 | Disposition: A | Discharge status: Home / Self Care | Payer: PRIVATE HEALTH INSURANCE | Source: Ambulatory Visit | Attending: Obstetrics & Gynecology | Admitting: Obstetrics & Gynecology

## 2015-09-21 DIAGNOSIS — Z36 Encounter for antenatal screening of mother: Secondary | ICD-10-CM | POA: Insufficient documentation

## 2015-09-21 DIAGNOSIS — Z3689 Encounter for other specified antenatal screening: Secondary | ICD-10-CM

## 2015-09-22 ENCOUNTER — Other Ambulatory Visit: Payer: PRIVATE HEALTH INSURANCE

## 2015-09-26 LAB — CREATININE CLEARANCE, URINE, 24 HOUR
CREATININE: 0.65 mg/dL (ref 0.50–1.10)
Creatinine Clearance: 128 mL/min — ABNORMAL HIGH (ref 75–115)
Creatinine, 24H Ur: 1195 mg/d (ref 700–1800)
Creatinine, Urine: 66.4 mg/dL

## 2015-09-26 LAB — CBC
HCT: 37.6 % (ref 36.0–46.0)
HEMOGLOBIN: 12.4 g/dL (ref 12.0–15.0)
MCH: 29 pg (ref 26.0–34.0)
MCHC: 33 g/dL (ref 30.0–36.0)
MCV: 88.1 fL (ref 78.0–100.0)
MPV: 10.3 fL (ref 8.6–12.4)
PLATELETS: 310 10*3/uL (ref 150–400)
RBC: 4.27 MIL/uL (ref 3.87–5.11)
RDW: 13.9 % (ref 11.5–15.5)
WBC: 7.6 10*3/uL (ref 4.0–10.5)

## 2015-09-26 LAB — COMPREHENSIVE METABOLIC PANEL
ALK PHOS: 45 U/L (ref 33–115)
ALT: 41 U/L — AB (ref 6–29)
AST: 40 U/L — ABNORMAL HIGH (ref 10–30)
Albumin: 3.5 g/dL — ABNORMAL LOW (ref 3.6–5.1)
BUN: 12 mg/dL (ref 7–25)
CO2: 22 mmol/L (ref 20–31)
CREATININE: 0.65 mg/dL (ref 0.50–1.10)
Calcium: 9.4 mg/dL (ref 8.6–10.2)
Chloride: 105 mmol/L (ref 98–110)
Glucose, Bld: 71 mg/dL (ref 65–99)
POTASSIUM: 4.3 mmol/L (ref 3.5–5.3)
SODIUM: 136 mmol/L (ref 135–146)
TOTAL PROTEIN: 6.3 g/dL (ref 6.1–8.1)
Total Bilirubin: 0.4 mg/dL (ref 0.2–1.2)

## 2015-09-26 LAB — PROTEIN, URINE, 24 HOUR
PROTEIN, URINE: 6 mg/dL (ref 5–24)
Protein, 24H Urine: 108 mg/d (ref <150)

## 2015-10-02 ENCOUNTER — Ambulatory Visit: Payer: PRIVATE HEALTH INSURANCE | Admitting: *Deleted

## 2015-10-02 VITALS — BP 135/88 | HR 95 | Resp 16

## 2015-10-02 DIAGNOSIS — O10919 Unspecified pre-existing hypertension complicating pregnancy, unspecified trimester: Secondary | ICD-10-CM

## 2015-10-02 NOTE — Progress Notes (Signed)
Patient here for follow-up of elevated blood pressure.  She is not exercising and is adherent to a low-salt diet.  Blood pressure is well controlled at home. Cardiac symptoms: none. Patient denies: headaches, blurred vision. Cardiovascular risk factors: none. Use of agents associated with hypertension: none. Bp reading today is about what its been at home currently and pre-pregnancy per pt stmt Current medication is Labetalol and pt has taken medication today at apprx 8-8:30am.  Follow up as scheduled.

## 2015-10-16 ENCOUNTER — Ambulatory Visit (INDEPENDENT_AMBULATORY_CARE_PROVIDER_SITE_OTHER): Payer: PRIVATE HEALTH INSURANCE

## 2015-10-16 ENCOUNTER — Ambulatory Visit (INDEPENDENT_AMBULATORY_CARE_PROVIDER_SITE_OTHER): Payer: PRIVATE HEALTH INSURANCE | Admitting: Certified Nurse Midwife

## 2015-10-16 ENCOUNTER — Encounter (HOSPITAL_COMMUNITY): Payer: Self-pay | Admitting: *Deleted

## 2015-10-16 ENCOUNTER — Inpatient Hospital Stay (HOSPITAL_COMMUNITY)
Admission: AD | Admit: 2015-10-16 | Discharge: 2015-10-18 | DRG: 774 | Disposition: A | Discharge status: Home / Self Care | Payer: PRIVATE HEALTH INSURANCE | Source: Ambulatory Visit | Attending: Family Medicine | Admitting: Family Medicine

## 2015-10-16 VITALS — BP 134/88 | HR 81 | Wt 226.0 lb

## 2015-10-16 DIAGNOSIS — O368120 Decreased fetal movements, second trimester, not applicable or unspecified: Secondary | ICD-10-CM | POA: Diagnosis not present

## 2015-10-16 DIAGNOSIS — O364XX0 Maternal care for intrauterine death, not applicable or unspecified: Principal | ICD-10-CM | POA: Diagnosis present

## 2015-10-16 DIAGNOSIS — R748 Abnormal levels of other serum enzymes: Secondary | ICD-10-CM

## 2015-10-16 DIAGNOSIS — Z3482 Encounter for supervision of other normal pregnancy, second trimester: Secondary | ICD-10-CM

## 2015-10-16 DIAGNOSIS — O364XX1 Maternal care for intrauterine death, fetus 1: Secondary | ICD-10-CM

## 2015-10-16 DIAGNOSIS — O034 Incomplete spontaneous abortion without complication: Secondary | ICD-10-CM | POA: Diagnosis not present

## 2015-10-16 DIAGNOSIS — Z3A16 16 weeks gestation of pregnancy: Secondary | ICD-10-CM

## 2015-10-16 DIAGNOSIS — O1092 Unspecified pre-existing hypertension complicating childbirth: Secondary | ICD-10-CM | POA: Diagnosis present

## 2015-10-16 DIAGNOSIS — Z3A22 22 weeks gestation of pregnancy: Secondary | ICD-10-CM | POA: Diagnosis not present

## 2015-10-16 DIAGNOSIS — IMO0002 Reserved for concepts with insufficient information to code with codable children: Secondary | ICD-10-CM | POA: Diagnosis present

## 2015-10-16 DIAGNOSIS — O134 Gestational [pregnancy-induced] hypertension without significant proteinuria, complicating childbirth: Secondary | ICD-10-CM | POA: Diagnosis not present

## 2015-10-16 DIAGNOSIS — O42912 Preterm premature rupture of membranes, unspecified as to length of time between rupture and onset of labor, second trimester: Secondary | ICD-10-CM | POA: Diagnosis not present

## 2015-10-16 DIAGNOSIS — Z3A21 21 weeks gestation of pregnancy: Secondary | ICD-10-CM | POA: Diagnosis not present

## 2015-10-16 LAB — CBC
HCT: 39.9 % (ref 36.0–46.0)
HEMATOCRIT: 38.3 % (ref 36.0–46.0)
Hemoglobin: 13.3 g/dL (ref 12.0–15.0)
Hemoglobin: 12.8 g/dL (ref 12.0–15.0)
MCH: 30 pg (ref 26.0–34.0)
MCH: 29.6 pg (ref 26.0–34.0)
MCHC: 33.3 g/dL (ref 30.0–36.0)
MCHC: 33.4 g/dL (ref 30.0–36.0)
MCV: 90.1 fL (ref 78.0–100.0)
MCV: 88.7 fL (ref 78.0–100.0)
MPV: 9.2 fL (ref 8.6–12.4)
PLATELETS: 309 10*3/uL (ref 150–400)
Platelets: 306 10*3/uL (ref 150–400)
RBC: 4.43 MIL/uL (ref 3.87–5.11)
RBC: 4.32 MIL/uL (ref 3.87–5.11)
RDW: 13.7 % (ref 11.5–15.5)
RDW: 13.6 % (ref 11.5–15.5)
WBC: 6.4 10*3/uL (ref 4.0–10.5)
WBC: 9 10*3/uL (ref 4.0–10.5)

## 2015-10-16 LAB — COMPREHENSIVE METABOLIC PANEL
ALT: 52 U/L — ABNORMAL HIGH (ref 6–29)
AST: 40 U/L — ABNORMAL HIGH (ref 10–30)
Albumin: 3.6 g/dL (ref 3.6–5.1)
Alkaline Phosphatase: 59 U/L (ref 33–115)
BUN: 8 mg/dL (ref 7–25)
CO2: 21 mmol/L (ref 20–31)
Calcium: 9.1 mg/dL (ref 8.6–10.2)
Chloride: 105 mmol/L (ref 98–110)
Creat: 0.66 mg/dL (ref 0.50–1.10)
Glucose, Bld: 116 mg/dL — ABNORMAL HIGH (ref 65–99)
Potassium: 4 mmol/L (ref 3.5–5.3)
Sodium: 138 mmol/L (ref 135–146)
Total Bilirubin: 0.5 mg/dL (ref 0.2–1.2)
Total Protein: 6.6 g/dL (ref 6.1–8.1)

## 2015-10-16 LAB — TYPE AND SCREEN
ABO/RH(D): A POS
Antibody Screen: NEGATIVE

## 2015-10-16 MED ORDER — OXYCODONE-ACETAMINOPHEN 5-325 MG PO TABS: 1.0000 | ORAL_TABLET | ORAL | Status: DC | PRN

## 2015-10-16 MED ORDER — LACTATED RINGERS IV SOLN
INTRAVENOUS | Status: DC
  Administered 2015-10-16 – 2015-10-18 (×5): via INTRAVENOUS

## 2015-10-16 MED ORDER — OXYTOCIN 40 UNITS IN LACTATED RINGERS INFUSION - SIMPLE MED: 62.5000 mL/h | INTRAVENOUS | Status: DC

## 2015-10-16 MED ORDER — NALBUPHINE HCL 10 MG/ML IJ SOLN: 5.0000 mg | INTRAMUSCULAR | Status: DC | PRN

## 2015-10-16 MED ORDER — CITRIC ACID-SODIUM CITRATE 334-500 MG/5ML PO SOLN: 30.0000 mL | ORAL | Status: DC | PRN

## 2015-10-16 MED ORDER — OXYTOCIN BOLUS FROM INFUSION: 500.0000 mL | INTRAVENOUS | Status: DC

## 2015-10-16 MED ORDER — ONDANSETRON HCL 4 MG/2ML IJ SOLN: 4.0000 mg | Freq: Four times a day (QID) | INTRAMUSCULAR | Status: DC | PRN

## 2015-10-16 MED ORDER — OXYCODONE-ACETAMINOPHEN 5-325 MG PO TABS
2.0000 | ORAL_TABLET | ORAL | Status: DC | PRN
  Administered 2015-10-17: 2 via ORAL
  Filled 2015-10-16: qty 2

## 2015-10-16 MED ORDER — LIDOCAINE HCL (PF) 1 % IJ SOLN
30.0000 mL | INTRAMUSCULAR | Status: DC | PRN
Start: 2015-10-16 — End: 2015-10-18

## 2015-10-16 MED ORDER — ACETAMINOPHEN 325 MG PO TABS
650.0000 mg | ORAL_TABLET | ORAL | Status: DC | PRN
  Administered 2015-10-16: 650 mg via ORAL
  Filled 2015-10-16: qty 2

## 2015-10-16 MED ORDER — LACTATED RINGERS IV SOLN: 500.0000 mL | INTRAVENOUS | Status: DC | PRN

## 2015-10-16 MED ORDER — MISOPROSTOL 200 MCG PO TABS
400.0000 ug | ORAL_TABLET | ORAL | Status: DC
Start: 2015-10-16 — End: 2015-10-17
  Administered 2015-10-16 – 2015-10-17 (×3): 400 ug via ORAL
  Filled 2015-10-16 (×5): qty 2

## 2015-10-16 MED ORDER — LABETALOL HCL 100 MG PO TABS
100.0000 mg | ORAL_TABLET | Freq: Three times a day (TID) | ORAL | Status: DC
  Administered 2015-10-16 – 2015-10-18 (×5): 100 mg via ORAL
  Filled 2015-10-16 (×5): qty 1

## 2015-10-16 NOTE — Progress Notes (Signed)
Can't confirm cardiac activity on Doppler or bedside US

## 2015-10-16 NOTE — H&P (Signed)
OBSTETRIC ADMISSION HISTORY AND PHYSICAL  Millee Jasmen Emrich is a 39 y.o. female G2P0010 with IUP at [redacted]w[redacted]d by L presenting for IUFD at approximately 16-17 weeks.   Has anatomy scan at ~18 weeks, normal anatomy but echogenic bowel. Female fetus.  FIRST screen wnl, NT was in 95th%tile  Of note, patient works as a Marine scientist and recently took care of a patient with active CMV who was immunosuppressed. Patient is documented to be rubella immune. She also traveled [place] within a few weeks of conception.   Prenatal History/Complications:  Past Medical History: Past Medical History  Diagnosis Date  . Boil, groin     recurrent of RT  . Fibroadenoma   . Vaginal Pap smear, abnormal   . Hypertension     Past Surgical History: Past Surgical History  Procedure Laterality Date  . Lt breast solid or complex nodule    . Lt breast needle biopsy      Obstetrical History: OB History    Gravida Para Term Preterm AB TAB SAB Ectopic Multiple Living   2 0   1 1    0      Social History: Social History   Social History  . Marital Status: Single    Spouse Name: N/A  . Number of Children: N/A  . Years of Education: N/A   Social History Main Topics  . Smoking status: Never Smoker   . Smokeless tobacco: None  . Alcohol Use: 0.0 - 0.5 oz/week    0-1 Standard drinks or equivalent per week  . Drug Use: None  . Sexual Activity:    Partners: Male    Birth Control/ Protection: Condom   Other Topics Concern  . None   Social History Narrative   No regular exercise.     Family History: Family History  Problem Relation Age of Onset  . Diabetes Maternal Grandfather   . Stroke Maternal Grandfather   . Seizures Mother   . Hypertension Mother   . Hypertension Father   . Cancer Neg Hx     Allergies: No Known Allergies  Prescriptions prior to admission  Medication Sig Dispense Refill Last Dose  . aspirin EC 81 MG tablet Take 1 tablet (81 mg total) by mouth daily. Take  after 12 weeks for prevention of preeclampssia later in pregnancy 300 tablet 2 10/16/2015 at 0800  . labetalol (NORMODYNE) 100 MG tablet Take 1 tablet (100 mg total) by mouth 3 (three) times daily. 90 tablet 3 10/16/2015 at 1500  . prenatal vitamin w/FE, FA (PRENATAL 1 + 1) 27-1 MG TABS tablet Take 1 tablet by mouth daily at 12 noon.   10/16/2015 at Unknown time  . Elastic Bandages & Supports (FUTURO KNEE HI SHEER HOSE) MISC 1 Device by Does not apply route daily. 1 each 1 Taking     Review of Systems   All systems reviewed and negative except as stated in HPI  Blood pressure 135/94, pulse 94, temperature 99.8 F (37.7 C), temperature source Oral, resp. rate 18, height 5\' 7"  (1.702 m), weight 226 lb (102.513 kg), last menstrual period 05/18/2015, SpO2 100 %. General appearance: alert, cooperative and appears stated age Lungs: clear to auscultation bilaterally Heart: regular rate and rhythm Abdomen: soft, non-tender; bowel sounds normal Extremities: Homans sign is negative, no sign of DVT Presentation: unsure  Prenatal labs: ABO, Rh: A/POS/-- (07/19 1607) Antibody: NEG (07/19 1607) Rubella:   RPR: NON REAC (07/19 1607)  HBsAg: NEGATIVE (07/19 1607)  HIV: NONREACTIVE (  07/19 1607)  GBS:      No results found for this or any previous visit (from the past 24 hour(s)).  Patient Active Problem List   Diagnosis Date Noted  . Elevated transaminase level 09/15/2015  . Zika exposure near conception 08/20/2015  . Advanced maternal age, primigravida 08/17/2015  . Chronic hypertension in pregnancy 07/20/2015  . Essential hypertension 10/31/2014  . Obesity (BMI 30-39.9) 10/05/2014  . WEIGHT GAIN 09/28/2009  . HEADACHE, TENSION 08/29/2008  . Tachycardia 03/30/2008  . VENEREAL WART 04/21/2007    Assessment: Maleeya Peterkin is a 39 y.o. G2P0010 at [redacted]w[redacted]d here for IOL for IUFD at ~16-17 weeks.   #Induction of Labor for IUFD -Cytotec 400g q 4 hours -TORCH titers -Placenta to  pathology  -IV pain medications -Counselor/SW and Pastor offered.  -Discussed IOL process and often delayed delivery of placenta, discussed low but present risk of needing D&E   Caren Macadam 10/16/2015, 7:14 PM

## 2015-10-16 NOTE — Progress Notes (Signed)
Subjective:  Misty Hodge is a 39 y.o. G2P0010 at 70w6dbeing seen today for ongoing prenatal care.  Patient reports no complaints.  Contractions: Not present.  Vag. Bleeding: None. Movement: Absent. Denies leaking of fluid.   The following portions of the patient's history were reviewed and updated as appropriate: allergies, current medications, past family history, past medical history, past social history, past surgical history and problem list. Problem list updated.  Objective:   Filed Vitals:   10/16/15 0901  BP: 134/88  Pulse: 81  Weight: 226 lb (102.513 kg)    Fetal Status:     Movement: Absent     General:  Alert, oriented and cooperative. Patient is in no acute distress.  Skin: Skin is warm and dry. No rash noted.   Cardiovascular: Normal heart rate noted  Respiratory: Normal respiratory effort, no problems with respiration noted  Abdomen: Soft, gravid, appropriate for gestational age. Pain/Pressure: Absent     Pelvic: Vag. Bleeding: None Vag D/C Character: Thin   Cervical exam deferred        Extremities: Normal range of motion.  Edema: None  Mental Status: Normal mood and affect. Normal behavior. Normal judgment and thought content.   Urinalysis: Urine Protein: Negative Urine Glucose: Negative  Assessment and Plan:  Pregnancy: G2P0010 at 281w6d1. Elevated liver enzymes Unable to hear FHT's. Sent to Ultrasound to confirm. Pt will be going home from Ultrasound and will need to be contacted if there is a fetal demise. - [place]; Future - Comp Met (CMET) - CBC  Preterm labor symptoms and general obstetric precautions including but not limited to vaginal bleeding, contractions, leaking of fluid and fetal movement were reviewed in detail with the patient. Please refer to After Visit Summary for other counseling recommendations.  Return in about 4 weeks (around 11/13/2015).   LoLarey DaysCNM

## 2015-10-16 NOTE — Patient Instructions (Signed)

## 2015-10-17 DIAGNOSIS — Z3A21 21 weeks gestation of pregnancy: Secondary | ICD-10-CM

## 2015-10-17 DIAGNOSIS — O42912 Preterm premature rupture of membranes, unspecified as to length of time between rupture and onset of labor, second trimester: Secondary | ICD-10-CM

## 2015-10-17 DIAGNOSIS — O134 Gestational [pregnancy-induced] hypertension without significant proteinuria, complicating childbirth: Secondary | ICD-10-CM

## 2015-10-17 LAB — RPR: RPR: NONREACTIVE

## 2015-10-17 LAB — ABO/RH: ABO/RH(D): A POS

## 2015-10-17 MED ORDER — MISOPROSTOL 200 MCG PO TABS
600.0000 ug | ORAL_TABLET | ORAL | Status: DC
  Administered 2015-10-17 – 2015-10-18 (×6): 600 ug via ORAL
  Filled 2015-10-17 (×8): qty 3

## 2015-10-17 MED ORDER — BUTORPHANOL TARTRATE 1 MG/ML IJ SOLN
2.0000 mg | INTRAMUSCULAR | Status: DC | PRN
Start: 2015-10-17 — End: 2015-10-18
  Administered 2015-10-17: 2 mg via INTRAVENOUS
  Filled 2015-10-17: qty 2

## 2015-10-17 MED ORDER — DIPHENOXYLATE-ATROPINE 2.5-0.025 MG PO TABS
2.0000 | ORAL_TABLET | Freq: Four times a day (QID) | ORAL | Status: DC | PRN
  Administered 2015-10-18: 2 via ORAL
  Filled 2015-10-17: qty 2

## 2015-10-17 MED ORDER — FENTANYL CITRATE (PF) 100 MCG/2ML IJ SOLN
100.0000 ug | INTRAMUSCULAR | Status: DC | PRN
  Administered 2015-10-17 (×3): 100 ug via INTRAVENOUS
  Filled 2015-10-17 (×3): qty 2

## 2015-10-17 NOTE — Progress Notes (Signed)
Chaplain was informed by the Nursing staff of the patient was had experienced a fetal demise.  Chaplain presented to the patient's room where the husband was also present at this time.  An introduction as Chaplain was done and words of comfort for their loss and prayer of comfort for their peace was appreciated by both. They expressed they have good family support and are connected to a faith community to help them thru the process of grief and loss.  Chaplain encouraged them to seek additional grief support as needed.  Spiritual support will follow as needed. Chaplain Yaakov Guthrie [phone]

## 2015-10-17 NOTE — Progress Notes (Signed)
LABOR PROGRESS NOTE  Misty Hodge is a 39 y.o. G2P0010 at [redacted]w[redacted]d  admitted for IOL 2/2 IUFD.  Subjective: Painful cramping with cytotec. SROM 16:30. Some diarrhea.  Objective: BP 123/81 mmHg  Pulse 88  Temp(Src) 99.2 F (37.3 C) (Oral)  Resp 18  Ht 5\' 7"  (1.702 m)  Wt 226 lb (102.513 kg)  BMI 35.39 kg/m2  SpO2 100%  LMP 05/18/2015 (Approximate) or  Filed Vitals:   10/17/15 0810 10/17/15 1017 10/17/15 1230 10/17/15 1557  BP: 135/56 131/84 121/74 123/81  Pulse: 102 88 82 88  Temp: 98.9 F (37.2 C)  98.7 F (37.1 C) 99.2 F (37.3 C)  TempSrc: Oral  Oral Oral  Resp: 20  20 18   Height:      Weight:      SpO2: 99%  99% 100%    Labs: Lab Results  Component Value Date   WBC 9.0 11/15/15   HGB 12.8 11/15/15   HCT 38.3 11/15/2015   MCV 88.7 11-15-15   PLT 309 2015-11-15    Patient Active Problem List   Diagnosis Date Noted  . IUFD (intrauterine fetal death) 11/15/2015  . Elevated transaminase level 09/15/2015  . Zika exposure near conception 08/20/2015  . Advanced maternal age, primigravida 08/17/2015  . Chronic hypertension in pregnancy 07/20/2015  . Essential hypertension 10/31/2014  . Obesity (BMI 30-39.9) 10/05/2014  . WEIGHT GAIN 09/28/2009  . HEADACHE, TENSION 08/29/2008  . Tachycardia 03/30/2008  . VENEREAL WART 04/21/2007    Assessment / Plan: 39 y.o. G2P0010 at [redacted]w[redacted]d here for IOL 2/2 IUFD  Labor: continuing cytotec 600 mg po q4 hrs IUFD: torch titers sent, patient considering autopsy Pain Control:  fentanyl Anticipated MOD:  vaginal  Desma Maxim, MD 10/17/2015, 6:09 PM

## 2015-10-18 ENCOUNTER — Encounter (HOSPITAL_COMMUNITY): Payer: Self-pay

## 2015-10-18 LAB — TORCH-IGM(TOXO/ RUB/ CMV/ HSV) W TITER
Rubella IgM: <20 AU/mL (ref 0.0–19.9)
Toxoplasma Antibody- IgM: <3 AU/mL (ref 0.0–7.9)

## 2015-10-18 LAB — INFECT DISEASE AB IGM REFLEX 1

## 2015-10-18 MED ORDER — NEBIVOLOL HCL 5 MG PO TABS: 5.0000 mg | ORAL_TABLET | Freq: Every day | ORAL | Status: DC

## 2015-10-18 MED ORDER — METHYLERGONOVINE MALEATE 0.2 MG PO TABS
0.2000 mg | ORAL_TABLET | ORAL | Status: DC
  Administered 2015-10-18 (×2): 0.2 mg via ORAL
  Filled 2015-10-18 (×2): qty 1

## 2015-10-18 MED ORDER — METHYLERGONOVINE MALEATE 0.2 MG PO TABS: 0.2000 mg | ORAL_TABLET | ORAL | Status: DC

## 2015-10-18 MED ORDER — ACETAMINOPHEN 325 MG PO TABS: 650.0000 mg | ORAL_TABLET | ORAL | Status: DC | PRN

## 2015-10-18 NOTE — Progress Notes (Signed)
Patient ID: Misty Hodge, female   DOB: 08/02/1976, 39 y.o.   MRN: [id number] Patient seen this morning with minimal complaints. She reports some mild cramping pains and some minimal vaginal bleeding.  SVE: cervical exam unchanged from previous with palpable placenta at os and cervix approximately 3.5 cm dilated Using a sterile speculum and ring Forceps, the placenta was carefully removed in its entirety. Minimal vaginal bleeding noted  A/P 39 yo s/p SVD of 22 week IUFD - will give methergine for 24 hours - Patient will be observed for a few hours with plans for discharge later in the day if stable - continue current care

## 2015-10-18 NOTE — Progress Notes (Signed)
Patient is lying comfortably in bed. She delivered a non viable fetus at 2020 on 10/17/2015. Cord was clamped and cut and placenta remained in situ. Patient denies any cramping or vaginal bleeding.  SVE: cervix dilated to 3.5 cm with placental tissue palpable at the os. Unable to grab it.  A/P 39 yo G2P0 s/P SVD of 22 week IUFD with placenta insitu - Will continue with cytotec - Will re-check in a few hours - Continue current care

## 2015-10-18 NOTE — Discharge Summary (Signed)
OB Discharge Summary     Patient Name: Misty Hodge DOB: 1976-10-06 MRN: [id number]  Date of admission: 10/16/2015 Delivering MD: Dewaine Oats   Date of discharge: 10/18/2015  Admitting diagnosis: IUFD Intrauterine pregnancy: [redacted]w[redacted]d     Secondary diagnosis: Chronic Hypertension     Discharge diagnosis: IUFD, delivered                                                                                      Post partum procedures: bedside removal of placenta with curved kelly forceps  Augmentation: Cytotec  Complications: None  Hospital course:  Patient presented with IUFD. Normal anatomy scan at approximately 18 weeks; the day of admission patient had presented for regularly-scheduled prenatal appointment. Doppler heart tones were not found, expeditious limited ultrasound revealed IUFD. Patient was admitted and induction was started with cytotec 400 mcg po q4, later increased to 600 mcg po q4 hours. On hospital day 1 the patient delivered a non-viable fetus at approximately 8 PM. We continued cytotec 600 mcg po q4 hours. On exam at 3 AM placenta was visible at cervical os but could not be grasped. Another dose of cytotec was given and at re-check at 6 AM placenta still visible at cervical os, which was removed at the bedside with sterile speculum and curved kelly forceps. The patient was started on oral methergine with plans to continue for 24 hours. The patient was monitored for several hours to ensure hemostasis. The patient will follow-up with her prenatal provider, and we will also refer her to maternal fetal medicine. The patient was seen by the pastor and had numerous family members, including a very supportive husband, at her bedside. Grief counseling options were shared and encouraged. The patient declined autopsy. Placenta was sent to pathology; torch titers are pending.  The patient will be discharged on bystolic 5 mg po qd, which was her pre-pregnancy antihypertensive.  Her BPs while here were wnl (she was continued on her pregnancy dose of labetalol).  Physical exam  Filed Vitals:   10/18/15 0411 10/18/15 0511 10/18/15 0611 10/18/15 0718  BP: 134/74 137/86 143/90 115/79  Pulse: 88 86 98 94  Temp: 99 F (37.2 C)   99 F (37.2 C)  TempSrc: Oral   Oral  Resp:    20  Height:      Weight:      SpO2:    98%   General: alert, cooperative and no distress Lochia: appropriate Uterine Fundus: firm DVT Evaluation: No cords or calf tenderness. Labs: Lab Results  Component Value Date   WBC 9.0 10/16/2015   HGB 12.8 10/16/2015   HCT 38.3 10/16/2015   MCV 88.7 10/16/2015   PLT 309 10/16/2015   CMP Latest Ref Rng 10/16/2015  Glucose 65 - 99 mg/dL 116(H)  BUN 7 - 25 mg/dL 8  Creatinine 0.50 - 1.10 mg/dL 0.66  Sodium 135 - 146 mmol/L 138  Potassium 3.5 - 5.3 mmol/L 4.0  Chloride 98 - 110 mmol/L 105  CO2 20 - 31 mmol/L 21  Calcium 8.6 - 10.2 mg/dL 9.1  Total Protein 6.1 - 8.1 g/dL 6.6  Total Bilirubin 0.2 - 1.2  mg/dL 0.5  Alkaline Phos 33 - 115 U/L 59  AST 10 - 30 U/L 40(H)  ALT 6 - 29 U/L 52(H)    Discharge instruction: per After Visit Summary and "Baby and Me Booklet".    Medication List    STOP taking these medications        aspirin EC 81 MG tablet     FUTURO KNEE HI SHEER HOSE Misc     labetalol 100 MG tablet  Commonly known as:  NORMODYNE     prenatal vitamin w/FE, FA 27-1 MG Tabs tablet      TAKE these medications        acetaminophen 325 MG tablet  Commonly known as:  TYLENOL  Take 2 tablets (650 mg total) by mouth every 4 (four) hours as needed (for pain scale < 4ORtemperature>/=100.5 F).     methylergonovine 0.2 MG tablet  Commonly known as:  METHERGINE  Take 1 tablet (0.2 mg total) by mouth every 4 (four) hours.     nebivolol 5 MG tablet  Commonly known as:  BYSTOLIC  Take 1 tablet (5 mg total) by mouth daily.        Diet: routine diet  Activity: Advance as tolerated. Pelvic rest for 6 weeks.    Outpatient follow up: 2-6 weeks, and MFM consult   Newborn Data: Deceased born female  Birth Weight: 3.2 oz (91 g) APGAR: 0, 0  Disposition: home   10/18/2015 Desma Maxim, MD

## 2015-10-18 NOTE — Progress Notes (Signed)
I spent time with Misty Hodge and Misty Hodge as they continued to process the loss of their baby, Misty Hodge.  We spoke about each of their coping styles and how to handle the reactions of others both positive and negative.  Misty Hodge had also just lost his job and had a job interview by phone today while in the hospital.  I let them know about the support from [place] as well as our ongoing 1:1 bereavement support here at the hospital.  They were very appreciative of all the care they received while here.  Chaplain Janne Napoleon, Bcc Pager, [phone] 4:30 PM    10/18/15 1600  Clinical Encounter Type  Visited With Patient and family together  Visit Type Spiritual support  Spiritual Encounters  Spiritual Needs Grief support  Stress Factors  Patient Stress Factors (Fetal loss)

## 2015-10-20 ENCOUNTER — Ambulatory Visit (HOSPITAL_COMMUNITY): Payer: PRIVATE HEALTH INSURANCE

## 2015-10-31 ENCOUNTER — Ambulatory Visit (INDEPENDENT_AMBULATORY_CARE_PROVIDER_SITE_OTHER): Payer: PRIVATE HEALTH INSURANCE | Admitting: Obstetrics & Gynecology

## 2015-10-31 ENCOUNTER — Encounter: Payer: Self-pay | Admitting: Obstetrics & Gynecology

## 2015-10-31 VITALS — BP 141/100 | HR 103 | Resp 16 | Ht 67.0 in | Wt 218.0 lb

## 2015-10-31 DIAGNOSIS — IMO0002 Reserved for concepts with insufficient information to code with codable children: Secondary | ICD-10-CM

## 2015-10-31 NOTE — Progress Notes (Signed)
   Subjective:    Patient ID: Misty Hodge, female    DOB: 02-Jan-1976, 39 y.o.   MRN: [id number]  HPI  Misty Hodge also is a 39 year old female who presents 2 weeks status post vaginal delivery and manual placental removal after a 22 week IUFD.  Patient is still bleeding slightly after the vaginal delivery no clots or foul odor. Patient feels well other than being extremely side about the recent IUFD. Patient does cry frequently. She is sleeping more than usual, appetite is decreased. She does not have any S suicidal or homicidal ideation. Patient does have decreased active interest in activities. She is to return to work later this week patient has not reached out the heart strings. Patient does not want to start antidepressants at this time. Patient has a supportive husband who will call us if things seem to be getting worse.  Final pathology showed multiple infarcts. Torch titers were negative. Genetics/chromosomes are not in the computer. It is unclear whether they were sent.  Patient had echogenic bowel on her anatomy ultrasound.  Patient had refused further genetic testing. Of interest the BPD on the day of the IUFD measured at 16-1/2 weeks. This is smaller than the anatomy ultrasound 4-5 weeks prior. The BPD was read by radiologist not an MFM. It is unclear whether the baby had IUGR.  Patient did have exposure dizzy, early in the pregnancy. RN a titers were negative. Patient did not have unprotected sex with her husband after titers were drawn.  Review of Systems  Constitutional: Negative.   Respiratory: Negative.   Cardiovascular: Negative.   Gastrointestinal: Negative.   Genitourinary: Positive for vaginal bleeding. Negative for vaginal discharge.  Musculoskeletal: Negative.        Objective:   Physical Exam  Constitutional: She is oriented to person, place, and time. She appears well-developed and well-nourished. No distress.  HENT:  Head: Normocephalic and atraumatic.  Eyes:  Conjunctivae are normal.  Pulmonary/Chest: Effort normal.  Abdominal: Soft. There is no tenderness.  Musculoskeletal: She exhibits no edema.  Neurological: She is alert and oriented to person, place, and time.  Skin: Skin is warm and dry.  Psychiatric: She has a normal mood and affect.  Vitals reviewed.         Assessment & Plan:  39 year old female with a 22 week IUFD.  1-APLA workup 2-patient originally the heart strings 3-patient to return to the office for medication and counseling if mood does not improve in the next 7-10 days. 4-return to clinic 4 weeks

## 2015-11-01 ENCOUNTER — Other Ambulatory Visit: Payer: PRIVATE HEALTH INSURANCE

## 2015-11-01 LAB — BETA-2 GLYCOPROTEIN ANTIBODIES
BETA-2-GLYCOPROTEIN I IGA: 9 SAU (ref <=20)
BETA-2-GLYCOPROTEIN I IGM: 9 SMU (ref <=20)
Beta-2 Glyco I IgG: 9 SGU (ref <=20)

## 2015-11-01 LAB — CARDIOLIPIN ANTIBODIES, IGG, IGM, IGA
Anticardiolipin IgA: <11 [APL'U]
Anticardiolipin IgG: <14 [GPL'U]
Anticardiolipin IgM: <12 [MPL'U]

## 2015-11-01 NOTE — Progress Notes (Signed)
Pt here for a repeat LAC due to lab error.  Not enough blood collected nor sent frozen.  No charge to patient

## 2015-11-02 LAB — LUPUS ANTICOAGULANT PANEL

## 2015-11-03 LAB — RFX PTT-LA W/RFX TO HEX PHASE CONF: PTT-LA Screen: 30 s (ref <=40)

## 2015-11-03 LAB — LUPUS ANTICOAGULANT PANEL

## 2015-11-03 LAB — RFX DRVVT SCR W/RFLX CONF 1:1 MIX: dRVVT Screen: 24 s (ref <=45)

## 2015-11-06 ENCOUNTER — Telehealth: Payer: Self-pay | Admitting: *Deleted

## 2015-11-06 NOTE — Telephone Encounter (Signed)
LM on voicemail of neg labs drawn last week.

## 2015-11-06 NOTE — Telephone Encounter (Signed)
-----   Message from Guss Bunde, MD sent at 11/04/2015  8:05 AM EDT ----- Labs normal.  RN to call patient

## 2015-11-29 ENCOUNTER — Encounter: Payer: Self-pay | Admitting: Obstetrics & Gynecology

## 2015-11-29 ENCOUNTER — Ambulatory Visit (INDEPENDENT_AMBULATORY_CARE_PROVIDER_SITE_OTHER): Payer: PRIVATE HEALTH INSURANCE | Admitting: Obstetrics & Gynecology

## 2015-11-29 VITALS — BP 147/97 | HR 99 | Resp 16 | Ht 67.0 in | Wt 217.0 lb

## 2015-11-29 DIAGNOSIS — I10 Essential (primary) hypertension: Secondary | ICD-10-CM

## 2015-11-29 DIAGNOSIS — R945 Abnormal results of liver function studies: Principal | ICD-10-CM

## 2015-11-29 DIAGNOSIS — R7989 Other specified abnormal findings of blood chemistry: Secondary | ICD-10-CM

## 2015-11-29 DIAGNOSIS — F4320 Adjustment disorder, unspecified: Secondary | ICD-10-CM

## 2015-11-29 MED ORDER — LABETALOL HCL 200 MG PO TABS: 200.0000 mg | ORAL_TABLET | Freq: Two times a day (BID) | ORAL | Status: DC

## 2015-11-29 MED ORDER — PRENATAL VITAMINS 0.8 MG PO TABS: 1.0000 | ORAL_TABLET | Freq: Every day | ORAL | Status: DC

## 2015-11-29 NOTE — Progress Notes (Signed)
   Subjective:    Patient ID: Misty Hodge, female    DOB: Jan 23, 1976, 39 y.o.   MRN: [id number]  HPI  Patient presents for follow up of acute adjustment disorder and hypertension after a 20 week fetal loss.  Pt doing much better.  No depression.  Pt feels well and will try to conceive after next cycle.  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    BP high today.  On labetalol 100 mg tid.  She forgets to take one pill a day frequently.  Will change to 200 mg q 12. Pt to take BP at home   Review of Systems  Constitutional: Negative.   Respiratory: Negative.   Gastrointestinal: Negative.   Genitourinary: Negative.   Psychiatric/Behavioral: Negative.        Objective:   Physical Exam  Constitutional: She is oriented to person, place, and time. She appears well-developed and well-nourished. No distress.  HENT:  Head: Normocephalic and atraumatic.  Eyes: Conjunctivae are normal.  Cardiovascular: Normal rate.   Pulmonary/Chest: Effort normal.  Abdominal: Bowel sounds are normal.  Musculoskeletal: She exhibits no edema.  Neurological: She is alert and oriented to person, place, and time.  Skin: Skin is warm and dry.  Psychiatric: She has a normal mood and affect.  Vitals reviewed.  Filed Vitals:   11/29/15 0901  BP: 147/97  Pulse: 99  Resp: 16  Height: 5\' 7"  (1.702 m)  Weight: 217 lb (98.431 kg)           Assessment & Plan:  39 yo female with hypertension and history of 20 week loss  1-labetalol 200 mg bid 2-PNV 3-zika warnings 4-check BP 5-try to conceive after 1 more cycle. 6-check LFTs one more time (elevated at last visit)

## 2015-11-30 ENCOUNTER — Telehealth: Payer: Self-pay | Admitting: *Deleted

## 2015-11-30 NOTE — Telephone Encounter (Signed)
LM on home voicemail to call office to schedule a fasting CMP to recheck LFT and recheck BP

## 2015-11-30 NOTE — Telephone Encounter (Signed)
-----   Message from Guss Bunde, MD sent at 11/29/2015 11:20 AM EST ----- Pt has elevated LFT at last visit.  Please have her come in for one more cmp and check BP

## 2015-12-18 ENCOUNTER — Encounter: Payer: Self-pay | Admitting: *Deleted

## 2015-12-19 ENCOUNTER — Other Ambulatory Visit (INDEPENDENT_AMBULATORY_CARE_PROVIDER_SITE_OTHER): Payer: PRIVATE HEALTH INSURANCE

## 2015-12-19 DIAGNOSIS — I10 Essential (primary) hypertension: Secondary | ICD-10-CM

## 2015-12-19 DIAGNOSIS — IMO0001 Reserved for inherently not codable concepts without codable children: Secondary | ICD-10-CM

## 2015-12-19 DIAGNOSIS — R030 Elevated blood-pressure reading, without diagnosis of hypertension: Principal | ICD-10-CM

## 2015-12-20 ENCOUNTER — Telehealth: Payer: Self-pay | Admitting: *Deleted

## 2015-12-20 LAB — COMPREHENSIVE METABOLIC PANEL
ALBUMIN: 3.8 g/dL (ref 3.6–5.1)
ALT: 18 U/L (ref 6–29)
AST: 21 U/L (ref 10–30)
Alkaline Phosphatase: 65 U/L (ref 33–115)
BILIRUBIN TOTAL: 0.6 mg/dL (ref 0.2–1.2)
BUN: 9 mg/dL (ref 7–25)
CO2: 21 mmol/L (ref 20–31)
CREATININE: 0.81 mg/dL (ref 0.50–1.10)
Calcium: 8.7 mg/dL (ref 8.6–10.2)
Chloride: 105 mmol/L (ref 98–110)
Glucose, Bld: 93 mg/dL (ref 65–99)
Potassium: 4.2 mmol/L (ref 3.5–5.3)
SODIUM: 140 mmol/L (ref 135–146)
TOTAL PROTEIN: 6.5 g/dL (ref 6.1–8.1)

## 2015-12-20 NOTE — Telephone Encounter (Signed)
-----   Message from Guss Bunde, MD sent at 12/20/2015 12:58 PM EST ----- LFTs are normal.  Released to my chart

## 2015-12-20 NOTE — Telephone Encounter (Signed)
LM on voicemail of normal labwork and labs were released to my- chart

## 2016-02-23 ENCOUNTER — Ambulatory Visit: Payer: PRIVATE HEALTH INSURANCE | Admitting: Family

## 2016-03-21 ENCOUNTER — Encounter: Payer: Self-pay | Admitting: Obstetrics & Gynecology

## 2016-03-22 ENCOUNTER — Other Ambulatory Visit: Payer: Self-pay | Admitting: *Deleted

## 2016-03-22 DIAGNOSIS — I10 Essential (primary) hypertension: Secondary | ICD-10-CM

## 2016-03-22 MED ORDER — LABETALOL HCL 200 MG PO TABS: 200.0000 mg | ORAL_TABLET | Freq: Two times a day (BID) | ORAL | Status: DC

## 2016-03-22 NOTE — Telephone Encounter (Signed)
RF request for Labetalol sent to outpatient pharmacy at [facility]

## 2016-05-17 ENCOUNTER — Encounter: Payer: Self-pay | Admitting: Family

## 2016-05-17 ENCOUNTER — Ambulatory Visit (INDEPENDENT_AMBULATORY_CARE_PROVIDER_SITE_OTHER): Payer: PRIVATE HEALTH INSURANCE | Admitting: Family

## 2016-05-17 VITALS — BP 138/92 | HR 95 | Wt 223.0 lb

## 2016-05-17 DIAGNOSIS — Z36 Encounter for antenatal screening of mother: Secondary | ICD-10-CM

## 2016-05-17 DIAGNOSIS — A925 Zika virus disease: Secondary | ICD-10-CM

## 2016-05-17 DIAGNOSIS — O0990 Supervision of high risk pregnancy, unspecified, unspecified trimester: Secondary | ICD-10-CM | POA: Insufficient documentation

## 2016-05-17 DIAGNOSIS — O10912 Unspecified pre-existing hypertension complicating pregnancy, second trimester: Secondary | ICD-10-CM

## 2016-05-17 DIAGNOSIS — Z113 Encounter for screening for infections with a predominantly sexual mode of transmission: Secondary | ICD-10-CM

## 2016-05-17 DIAGNOSIS — Z1151 Encounter for screening for human papillomavirus (HPV): Secondary | ICD-10-CM

## 2016-05-17 DIAGNOSIS — Z3481 Encounter for supervision of other normal pregnancy, first trimester: Secondary | ICD-10-CM | POA: Diagnosis not present

## 2016-05-17 DIAGNOSIS — Z124 Encounter for screening for malignant neoplasm of cervix: Secondary | ICD-10-CM

## 2016-05-17 DIAGNOSIS — O10919 Unspecified pre-existing hypertension complicating pregnancy, unspecified trimester: Secondary | ICD-10-CM

## 2016-05-17 DIAGNOSIS — Z711 Person with feared health complaint in whom no diagnosis is made: Secondary | ICD-10-CM

## 2016-05-17 DIAGNOSIS — O09511 Supervision of elderly primigravida, first trimester: Secondary | ICD-10-CM

## 2016-05-17 NOTE — Progress Notes (Signed)
Bedside ultrasound reveals CRL 17.4 mm consistant with 8 weeks and 1 day. Kathrene Alu RN BSN

## 2016-05-18 LAB — OB RESULTS CONSOLE ANTIBODY SCREEN: ANTIBODY SCREEN: NEGATIVE

## 2016-05-18 LAB — OB RESULTS CONSOLE RUBELLA ANTIBODY, IGM: Rubella: IMMUNE

## 2016-05-18 LAB — OB RESULTS CONSOLE HEPATITIS B SURFACE ANTIGEN: HEP B S AG: NEGATIVE

## 2016-05-18 LAB — OB RESULTS CONSOLE RPR: RPR: NONREACTIVE

## 2016-05-18 LAB — OB RESULTS CONSOLE HIV ANTIBODY (ROUTINE TESTING): HIV: NONREACTIVE

## 2016-05-18 LAB — OB RESULTS CONSOLE ABO/RH: RH Type: POSITIVE

## 2016-05-19 LAB — CULTURE, URINE COMPREHENSIVE
Colony Count: NO GROWTH
Organism ID, Bacteria: NO GROWTH

## 2016-05-19 NOTE — Progress Notes (Signed)
Subjective:    Misty Hodge is a [id number] 104w1d being seen today for her first obstetrical visit.  Her obstetrical history is significant for advanced maternal age and obesity, chronic hypertension and 2nd trimester fetal demise.  Lengthy conversation regarding fears and concerns due to prior loss. Patient does intend to breast feed. Pregnancy history fully reviewed.  Patient reports no complaints.  Filed Vitals:   05/17/16 0815  BP: 138/92  Pulse: 95  Weight: 223 lb (101.152 kg)    HISTORY: OB History  Gravida Para Term Preterm AB SAB TAB Ectopic Multiple Living  3 1  1 1  1   0 0    # Outcome Date GA Lbr Len/2nd Weight Sex Delivery Anes PTL Lv  3 Current           2 Preterm 10/17/15 [redacted]w[redacted]d  3.2 oz (0.091 kg) M Vag-Spont None  FD  1 TAB              Past Medical History  Diagnosis Date  . Boil, groin     recurrent of RT  . Fibroadenoma   . Vaginal Pap smear, abnormal   . IUFD (intrauterine fetal death)   . Hypertension    Past Surgical History  Procedure Laterality Date  . Lt breast solid or complex nodule    . Lt breast needle biopsy     Family History  Problem Relation Age of Onset  . Diabetes Maternal Grandfather   . Stroke Maternal Grandfather   . Seizures Mother   . Hypertension Mother   . Hypertension Father   . Cancer Neg Hx      Exam    BP 138/92 mmHg  Pulse 95  Wt 223 lb (101.152 kg)  LMP 03/21/2016 Uterine Size: size equals dates  Pelvic Exam:    Perineum: No Hemorrhoids, Normal Perineum   Vulva: normal   Vagina:  normal mucosa, normal discharge, no palpable nodules   pH: Not done   Cervix: no bleeding following Pap, no cervical motion tenderness and no lesions   Adnexa: normal adnexa and no mass, fullness, tenderness   Bony Pelvis: Adequate  System: Breast:  No nipple retraction or dimpling, No nipple discharge or bleeding, No axillary or supraclavicular adenopathy, Normal to palpation without dominant masses   Skin: normal  coloration and turgor, no rashes    Neurologic: negative   Extremities: normal strength, tone, and muscle mass   HEENT neck supple with midline trachea and thyroid without masses   Mouth/Teeth mucous membranes moist, pharynx normal without lesions   Neck supple and no masses   Cardiovascular: regular rate and rhythm, no murmurs or gallops   Respiratory:  appears well, vitals normal, no respiratory distress, acyanotic, normal RR, neck free of mass or lymphadenopathy, chest clear, no wheezing, crepitations, rhonchi, normal symmetric air entry   Abdomen: soft, non-tender; bowel sounds normal; no masses,  no organomegaly   Urinary: urethral meatus normal      Assessment:    Pregnancy: [id number] Patient Active Problem List   Diagnosis Date Noted  . Supervision of normal pregnancy, antepartum 05/17/2016  . IUFD (intrauterine fetal death) 11-02-15  . Elevated transaminase level 09/15/2015  . Zika exposure near conception 08/20/2015  . Advanced maternal age, primigravida 08/17/2015  . Essential hypertension 10/31/2014  . Obesity (BMI 30-39.9) 10/05/2014  . WEIGHT GAIN 09/28/2009  . HEADACHE, TENSION 08/29/2008  . 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  Pap smear collected. Lengthy discussion regarding nutrition and weight gain during pregnancy.   Need baseline HTN labs next visit.  Prenatal vitamins. Problem list reviewed and updated. Genetic Screening discussed First Screen and Quad Screen: Due to maternal age and hx of prior loss, recommended NIPS.  Referral to MFM.  Follow up in 4 weeks.   Gwen Pounds 05/19/2016

## 2016-05-20 LAB — CYTOLOGY - PAP

## 2016-05-20 LAB — URINE CYTOLOGY ANCILLARY ONLY
Chlamydia: NEGATIVE
NEISSERIA GONORRHEA: NEGATIVE

## 2016-05-23 ENCOUNTER — Encounter: Payer: Self-pay | Admitting: Family

## 2016-05-23 ENCOUNTER — Other Ambulatory Visit: Payer: Self-pay | Admitting: *Deleted

## 2016-05-28 LAB — CYSTIC FIBROSIS MUTATION 97: Interpretation: NOT DETECTED

## 2016-05-30 ENCOUNTER — Ambulatory Visit (HOSPITAL_COMMUNITY): Payer: PRIVATE HEALTH INSURANCE

## 2016-06-14 ENCOUNTER — Encounter (HOSPITAL_COMMUNITY): Payer: Self-pay | Admitting: Obstetrics & Gynecology

## 2016-06-17 ENCOUNTER — Ambulatory Visit (INDEPENDENT_AMBULATORY_CARE_PROVIDER_SITE_OTHER): Payer: PRIVATE HEALTH INSURANCE | Admitting: Obstetrics & Gynecology

## 2016-06-17 VITALS — BP 140/87 | HR 113 | Wt 224.0 lb

## 2016-06-17 DIAGNOSIS — Z36 Encounter for antenatal screening of mother: Secondary | ICD-10-CM

## 2016-06-17 DIAGNOSIS — O162 Unspecified maternal hypertension, second trimester: Secondary | ICD-10-CM | POA: Diagnosis not present

## 2016-06-17 DIAGNOSIS — Z3401 Encounter for supervision of normal first pregnancy, first trimester: Secondary | ICD-10-CM

## 2016-06-17 LAB — CBC
HEMATOCRIT: 38.1 % (ref 35.0–45.0)
HEMOGLOBIN: 12.3 g/dL (ref 11.7–15.5)
MCH: 28.8 pg (ref 27.0–33.0)
MCHC: 32.3 g/dL (ref 32.0–36.0)
MCV: 89.2 fL (ref 80.0–100.0)
MPV: 9.3 fL (ref 7.5–12.5)
Platelets: 273 10*3/uL (ref 140–400)
RBC: 4.27 MIL/uL (ref 3.80–5.10)
RDW: 13.7 % (ref 11.0–15.0)
WBC: 8.8 10*3/uL (ref 3.8–10.8)

## 2016-06-17 MED ORDER — ASPIRIN EC 81 MG PO TBEC: 81.0000 mg | DELAYED_RELEASE_TABLET | Freq: Every day | ORAL | Status: DC

## 2016-06-17 NOTE — Progress Notes (Signed)
Subjective:  Misty Hodge is a 40 y.o. G3P0110 at 25w4dbeing seen today for ongoing prenatal care.  She is currently monitored for the following issues for this high-risk pregnancy and has VENEREAL WART; HEADACHE, TENSION; WEIGHT GAIN; Obesity (BMI 30-39.9); Essential hypertension; AMA (advanced maternal age) primigravida 329+ Zika exposure near conception; Elevated transaminase level; IUFD (intrauterine fetal death); Supervision of high risk pregnancy, antepartum; and Chronic hypertension during pregnancy, antepartum on her problem list.  Patient reports no complaints.   . Vag. Bleeding: None.  Movement: Absent. Denies leaking of fluid.   The following portions of the patient's history were reviewed and updated as appropriate: allergies, current medications, past family history, past medical history, past social history, past surgical history and problem list. Problem list updated.  Objective:   Filed Vitals:   06/17/16 1558  BP: 140/87  Pulse: 113  Weight: 224 lb (101.606 kg)    Fetal Status: Fetal Heart Rate (bpm): +   Movement: Absent     General:  Alert, oriented and cooperative. Patient is in no acute distress.  Skin: Skin is warm and dry. No rash noted.   Cardiovascular: Normal heart rate noted  Respiratory: Normal respiratory effort, no problems with respiration noted  Abdomen: Soft, gravid, appropriate for gestational age. Pain/Pressure: Absent     Pelvic: Cervical exam deferred        Extremities: Normal range of motion.  Edema: None  Mental Status: Normal mood and affect. Normal behavior. Normal judgment and thought content.   Urinalysis: Urine Protein: Negative Urine Glucose: Negative  Assessment and Plan:  Pregnancy: G3P0110 at 18w4d1. Hypertension in pregnancy, second trimester -BP stable on Labetalol 20019mid - CBC - HIV antibody (with reflex) - Comp Met (CMET) - Protein / Creatinine Ratio, Urine-->if greater than .15, then will get 24 hour urine. -ASA  to start for pre E prevention  Preterm labor symptoms and general obstetric precautions including but not limited to vaginal bleeding, contractions, leaking of fluid and fetal movement were reviewed in detail with the patient. Please refer to After Visit Summary for other counseling recommendations.  Return in about 3 weeks (around 07/08/2016).   KelGuss BundeD

## 2016-06-18 LAB — COMPREHENSIVE METABOLIC PANEL
ALBUMIN: 3.7 g/dL (ref 3.6–5.1)
ALT: 19 U/L (ref 6–29)
AST: 30 U/L (ref 10–30)
Alkaline Phosphatase: 58 U/L (ref 33–115)
BILIRUBIN TOTAL: 0.4 mg/dL (ref 0.2–1.2)
BUN: 11 mg/dL (ref 7–25)
CO2: 20 mmol/L (ref 20–31)
CREATININE: 0.82 mg/dL (ref 0.50–1.10)
Calcium: 9.3 mg/dL (ref 8.6–10.2)
Chloride: 103 mmol/L (ref 98–110)
Glucose, Bld: 94 mg/dL (ref 65–99)
Potassium: 4.4 mmol/L (ref 3.5–5.3)
SODIUM: 134 mmol/L — AB (ref 135–146)
TOTAL PROTEIN: 6.8 g/dL (ref 6.1–8.1)

## 2016-06-18 LAB — PROTEIN / CREATININE RATIO, URINE
CREATININE, URINE: 23 mg/dL (ref 20–320)
Total Protein, Urine: <4 mg/dL — ABNORMAL LOW (ref 5–24)

## 2016-06-18 LAB — HIV ANTIBODY (ROUTINE TESTING W REFLEX): HIV 1&2 Ab, 4th Generation: NONREACTIVE

## 2016-06-20 ENCOUNTER — Other Ambulatory Visit (HOSPITAL_COMMUNITY): Payer: Self-pay | Admitting: Maternal and Fetal Medicine

## 2016-06-20 ENCOUNTER — Ambulatory Visit (HOSPITAL_COMMUNITY)
Admission: RE | Admit: 2016-06-20 | Discharge: 2016-06-20 | Disposition: A | Discharge status: Home / Self Care | Payer: PRIVATE HEALTH INSURANCE | Source: Ambulatory Visit

## 2016-06-20 ENCOUNTER — Encounter (HOSPITAL_COMMUNITY): Payer: Self-pay

## 2016-06-20 ENCOUNTER — Ambulatory Visit (HOSPITAL_COMMUNITY)
Admission: RE | Admit: 2016-06-20 | Discharge: 2016-06-20 | Disposition: A | Discharge status: Home / Self Care | Payer: PRIVATE HEALTH INSURANCE | Source: Ambulatory Visit | Attending: Obstetrics & Gynecology | Admitting: Obstetrics & Gynecology

## 2016-06-20 VITALS — BP 135/88 | HR 99 | Wt 225.0 lb

## 2016-06-20 DIAGNOSIS — O10919 Unspecified pre-existing hypertension complicating pregnancy, unspecified trimester: Secondary | ICD-10-CM

## 2016-06-20 DIAGNOSIS — O99211 Obesity complicating pregnancy, first trimester: Secondary | ICD-10-CM | POA: Diagnosis not present

## 2016-06-20 DIAGNOSIS — O09521 Supervision of elderly multigravida, first trimester: Secondary | ICD-10-CM | POA: Insufficient documentation

## 2016-06-20 DIAGNOSIS — O10019 Pre-existing essential hypertension complicating pregnancy, unspecified trimester: Secondary | ICD-10-CM | POA: Diagnosis not present

## 2016-06-20 DIAGNOSIS — Z36 Encounter for antenatal screening of mother: Secondary | ICD-10-CM | POA: Insufficient documentation

## 2016-06-20 DIAGNOSIS — Z3A13 13 weeks gestation of pregnancy: Secondary | ICD-10-CM | POA: Insufficient documentation

## 2016-06-20 DIAGNOSIS — Z369 Encounter for antenatal screening, unspecified: Secondary | ICD-10-CM

## 2016-06-20 DIAGNOSIS — O09299 Supervision of pregnancy with other poor reproductive or obstetric history, unspecified trimester: Secondary | ICD-10-CM | POA: Insufficient documentation

## 2016-06-20 DIAGNOSIS — O0991 Supervision of high risk pregnancy, unspecified, first trimester: Secondary | ICD-10-CM

## 2016-06-20 DIAGNOSIS — O09511 Supervision of elderly primigravida, first trimester: Secondary | ICD-10-CM

## 2016-06-21 ENCOUNTER — Other Ambulatory Visit (HOSPITAL_COMMUNITY): Payer: Self-pay

## 2016-06-21 DIAGNOSIS — O09529 Supervision of elderly multigravida, unspecified trimester: Secondary | ICD-10-CM

## 2016-06-21 NOTE — Progress Notes (Signed)
Genetic Counseling  High-Risk Gestation Note  Appointment Date:  06/20/2016 Referred By: Guss Bunde, MD Date of Birth:  [birth year] Partner:  Emelia Salisbury   Pregnancy History: [id number] Estimated Date of Delivery: 12/26/16 Estimated Gestational Age: [redacted]w[redacted]d Attending: Renella Cunas, MD  Misty Hodge and her husband, Misty Hodge, were seen for genetic counseling because of a maternal age of 40 y.o..     In summary:  Discussed AMA and associated risk for fetal aneuploidy  Discussed options for screening  First screen-declined  Quad screen-declined  NIPS- elected to proceed with Panorama today  Ultrasound- NT ultrasound performed today; detailed ultrasound available in second trimester  Discussed diagnostic testing options  [facility]-declined  Amniocentesis-declined  Reviewed family history concerns  Previous pregnancy IUFD at approximately [redacted] weeks gestation; underlying cause unknown  Reviewed that recurrence risk estimate is not able to be quantified in the absence of known etiology  Discussed carrier screening options  CF-previous screening was negative for mutations analysis  SMA-declined  Hemoglobinopathies- previous screening for Hb S within normal range  Expanded carrier screening panel- declined  They were counseled regarding maternal age and the association with risk for chromosome conditions due to nondisjunction with aging of the ova.  We reviewed chromosomes, nondisjunction, and the associated 1 in 46 risk for fetal aneuploidy at [redacted]w[redacted]d gestation related to a maternal age of 40 years old at delivery.  They were counseled that the risk for aneuploidy decreases as gestational age increases, accounting for those pregnancies which spontaneously abort.  We specifically discussed Down syndrome (trisomy 68), trisomies 85 and 45, and sex chromosome aneuploidies (47,XXX and 47,XXY) including the common features and prognoses of each.   We reviewed  available screening options including First Screen, Quad screen, noninvasive prenatal screening (NIPS)/cell free DNA (cfDNA) screening, and detailed ultrasound. They were counseled that screening tests are used to modify a patient's a priori risk for aneuploidy, typically based on age. This estimate provides a pregnancy specific risk assessment. We reviewed the benefits and limitations of each option. Specifically, we discussed the conditions for which each test screens, the detection rates, and false positive rates of each. They were also counseled regarding diagnostic testing via CVS and amniocentesis. We reviewed the approximate 1 in [id number] risk for complications from amniocentesis, including spontaneous pregnancy loss. We discussed the possible results that the tests might provide including: positive, negative, unanticipated, and no result. Finally, they were counseled regarding the cost of each option and potential out of pocket expenses. After consideration of all the options, they elected to proceed with NIPS (Panorama through [facility]).  Those results will be available in 8-10 days.  They declined diagnostic testing, given the associated risk for complications.   They also expressed interest in pursuing a nuchal translucency ultrasound, which was performed today.  The report will be documented separately.  The patient would like to return for a detailed ultrasound at ~18+ weeks gestation.  This appointment was scheduled for 07/19/16. They understand that screening tests cannot rule out all birth defects or genetic syndromes. The patient was advised of this limitation and states she still does not want additional testing at this time.   Both family histories were reviewed and found to be contributory for intrauterine fetal demise at approximately 68 weeks for the couple's previous pregnancy. Anatomy ultrasound during that pregnancy did not visualized structural anomalies. Echogenic bowel was  noted. The patient reported that infection studies were performed for her following the loss and were within  normal limits. To the couple's knowledge, chromosome analysis was not performed on products of conception. We discussed that without an identified cause, recurrence risk estimate is not able to be quantified.   Misty Hodge reported an update regarding a female paternal first cousin once removed to the patient with albinism (the patient's paternal aunt's daughter's son). We discussed that there are many forms of albinism, and that they are typically due to a single gene. Various patterns of inheritance have been reported including autosomal recessive and X-linked recessive. Misty Hodge is unaware of the specific type of albinism the relative been diagnosed with. We reviewed that given the reported family history and degree of relation, recurrence risk for Misty Hodge's offspring may be increased above the general population risk, but overall likely low. Additional information regarding this history may alter recurrence risk assessment. Family histories were previously reviewed in detail on 08/17/15 genetic counseling visit. See previous genetic counseling note for detailed discussion of family history from that time. Without further information regarding the provided family history, an accurate genetic risk cannot be calculated. Further genetic counseling is warranted if more information is obtained.  Misty Hodge was provided with written information regarding cystic fibrosis (CF), spinal muscular atrophy (SMA) and hemoglobinopathies including the carrier frequency, availability of carrier screening and prenatal diagnosis if indicated.  In addition, we discussed that CF and hemoglobinopathies are routinely screened for as part of the [place] newborn screening panel. Misty Hodge previously had CF carrier screening, which was negative for the mutations screened. Thus, her risk to be a CF carrier  has been reduced. OB medical records also indicate that Misty Hodge previously had screening for Hemoglobin S (sickle cell trait), which was negative. Screening is available for additional, less common hemoglobin variants, if desired and if not previously performed.    In addition, they were counseled that there are a variety of genetic screening laboratories that have pan-ethnic, or expanded, carrier screening panels, which evaluate carrier status for a wide range of genetic conditions. Some of these conditions are severe and actionable, but also rare; others occur more commonly, but are less severe. We discussed that testing options range from screening for a single condition to panels of more than 200 autosomal recessive or X-linked genetic conditions. We reviewed that the prevalence of each condition varies (and often varies with ethnicity). Thus the couples' background risk to be a carrier for each of these various conditions would range, and in some cases be very low or unknown. Similarly, the detection rate varies with each condition and also varies in some cases with ethnicity, ranging from greater than 99% (in the case of hemoglobinopathies) to unknown. We reviewed that a negative carrier screen would thus reduce, but not eliminate the chance to be a carrier for these conditions. For some conditions included on specific pan-ethnic carrier screening panels, the pre-test carrier frequency and/or the detection rate is unknown. Thus, for some conditions, the exact reduction of risk with a negative carrier screening result may not be able to be quantified. We reviewed that in the event that one partner is found to be a carrier for one or more conditions, carrier screening would be available to the partner for those conditions. The couple was provided with written information regarding pan-ethnic carrier screening option. We discussed the risks, benefits, and limitations of carrier screening with the couple.   After further discussion, she declined screening for SMA and additional hemoglobinopathies. She also declined expanded carrier screening at this time.  Misty Hodge denied exposure to environmental toxins or chemical agents. She denied the use of alcohol, tobacco or street drugs. She denied significant viral illnesses during the course of her pregnancy. Her medical and surgical histories were contributory for hypertension, for which she is treated with labetalol.   I counseled this couple regarding the above risks and available options.  The approximate face-to-face time with the genetic counselor was 40 minutes.  Chipper Oman, MS,  Certified [employer] 06/21/2016

## 2016-06-28 ENCOUNTER — Other Ambulatory Visit (HOSPITAL_COMMUNITY): Payer: Self-pay

## 2016-06-28 ENCOUNTER — Telehealth (HOSPITAL_COMMUNITY): Payer: Self-pay | Admitting: MS"

## 2016-06-28 NOTE — Telephone Encounter (Signed)
Left message for patient to return call. Attempted to contact patient regarding results of prenatal cell free DNA testing, which are within normal limits.   Misty Hodge 06/28/2016 12:25 PM

## 2016-06-28 NOTE — Telephone Encounter (Signed)
Called Misty Hodge to discuss her prenatal cell free DNA test results.  Mrs. Junie Bame had Panorama testing through [facility].  Testing was offered because of advanced maternal age.   The patient was identified by name and DOB.  We reviewed that these are within normal limits, showing a less than 1 in 10,000 risk for trisomies 21, 18 and 13, and monosomy X (Turner syndrome).  In addition, the risk for triploidy and sex chromosome trisomies (47,XXX and 47,XXY) was also low risk. We reviewed that this testing identifies > 99% of pregnancies with trisomy 70, trisomy 86, sex chromosome trisomies (47,XXX and 47,XXY), and triploidy. The detection rate for trisomy 18 is 96%.  The detection rate for monosomy X is ~92%.  The false positive rate is <0.1% for all conditions. Testing was also consistent with female fetal sex.  The patient did wish to know fetal sex.  She understands that this testing does not identify all genetic conditions.  All questions were answered to her satisfaction, she was encouraged to call with additional questions or concerns.  Chipper Oman, MS Certified Genetic Counselor 06/28/2016 12:43 PM

## 2016-07-08 ENCOUNTER — Ambulatory Visit (INDEPENDENT_AMBULATORY_CARE_PROVIDER_SITE_OTHER): Payer: PRIVATE HEALTH INSURANCE | Admitting: Obstetrics & Gynecology

## 2016-07-08 VITALS — BP 127/82 | HR 107 | Wt 226.0 lb

## 2016-07-08 DIAGNOSIS — O09512 Supervision of elderly primigravida, second trimester: Secondary | ICD-10-CM

## 2016-07-08 DIAGNOSIS — O0992 Supervision of high risk pregnancy, unspecified, second trimester: Secondary | ICD-10-CM

## 2016-07-08 DIAGNOSIS — O10919 Unspecified pre-existing hypertension complicating pregnancy, unspecified trimester: Secondary | ICD-10-CM

## 2016-07-08 DIAGNOSIS — O10912 Unspecified pre-existing hypertension complicating pregnancy, second trimester: Secondary | ICD-10-CM

## 2016-07-08 DIAGNOSIS — E669 Obesity, unspecified: Secondary | ICD-10-CM

## 2016-07-08 NOTE — Progress Notes (Signed)
Subjective:  Misty Hodge is a 40 y.o. M AA G3P0110 at [redacted]w[redacted]d being seen today for ongoing prenatal care.  She is currently monitored for the following issues for this high-risk pregnancy and has VENEREAL WART; HEADACHE, TENSION; WEIGHT GAIN; Obesity (BMI 30-39.9); Essential hypertension; AMA (advanced maternal age) primigravida 91+; Zika exposure near conception; Elevated transaminase level; IUFD (intrauterine fetal death); Supervision of high risk pregnancy, antepartum; and Chronic hypertension during pregnancy, antepartum on her problem list.  Patient reports no complaints.   . Vag. Bleeding: None.  Movement: Absent. Denies leaking of fluid.   The following portions of the patient's history were reviewed and updated as appropriate: allergies, current medications, past family history, past medical history, past social history, past surgical history and problem list. Problem list updated.  Objective:   Filed Vitals:   07/08/16 1556  BP: 127/82  Pulse: 107  Weight: 226 lb (102.513 kg)    Fetal Status:     Movement: Absent     General:  Alert, oriented and cooperative. Patient is in no acute distress.  Skin: Skin is warm and dry. No rash noted.   Cardiovascular: Normal heart rate noted  Respiratory: Normal respiratory effort, no problems with respiration noted  Abdomen: Soft, gravid, appropriate for gestational age. Pain/Pressure: Absent     Pelvic:  Cervical exam deferred        Extremities: Normal range of motion.  Edema: Trace  Mental Status: Normal mood and affect. Normal behavior. Normal judgment and thought content.   Urinalysis: Urine Protein: Negative Urine Glucose: Negative  Assessment and Plan:  Pregnancy: G3P0110 at [redacted]w[redacted]d  1. Chronic hypertension during pregnancy, antepartum - on baby ASA  2. Supervision of high risk pregnancy, antepartum, second trimester - MFM anatomy u/s scheduled  3. AMA (advanced maternal age) primigravida 13+, second trimester - NIPS  shows normal female  4. Obesity (BMI 30-39.9) - early glucola at next visit and discussed weight gain recs  Preterm labor symptoms and general obstetric precautions including but not limited to vaginal bleeding, contractions, leaking of fluid and fetal movement were reviewed in detail with the patient. Please refer to After Visit Summary for other counseling recommendations.  Return in about 4 weeks (around 08/05/2016) for early glucola.   Emily Filbert, MD

## 2016-07-19 ENCOUNTER — Ambulatory Visit (HOSPITAL_COMMUNITY): Payer: PRIVATE HEALTH INSURANCE

## 2016-07-25 ENCOUNTER — Ambulatory Visit (HOSPITAL_COMMUNITY): Payer: PRIVATE HEALTH INSURANCE

## 2016-08-05 ENCOUNTER — Other Ambulatory Visit: Payer: Self-pay | Admitting: *Deleted

## 2016-08-05 ENCOUNTER — Ambulatory Visit (INDEPENDENT_AMBULATORY_CARE_PROVIDER_SITE_OTHER): Payer: PRIVATE HEALTH INSURANCE | Admitting: Obstetrics & Gynecology

## 2016-08-05 VITALS — BP 138/88 | HR 119 | Wt 232.0 lb

## 2016-08-05 DIAGNOSIS — O0993 Supervision of high risk pregnancy, unspecified, third trimester: Secondary | ICD-10-CM

## 2016-08-05 DIAGNOSIS — E669 Obesity, unspecified: Secondary | ICD-10-CM

## 2016-08-05 DIAGNOSIS — O10912 Unspecified pre-existing hypertension complicating pregnancy, second trimester: Secondary | ICD-10-CM

## 2016-08-05 DIAGNOSIS — O09512 Supervision of elderly primigravida, second trimester: Secondary | ICD-10-CM

## 2016-08-05 DIAGNOSIS — O10919 Unspecified pre-existing hypertension complicating pregnancy, unspecified trimester: Secondary | ICD-10-CM

## 2016-08-05 DIAGNOSIS — I10 Essential (primary) hypertension: Secondary | ICD-10-CM

## 2016-08-05 DIAGNOSIS — Z3492 Encounter for supervision of normal pregnancy, unspecified, second trimester: Secondary | ICD-10-CM

## 2016-08-05 DIAGNOSIS — O09513 Supervision of elderly primigravida, third trimester: Secondary | ICD-10-CM

## 2016-08-05 DIAGNOSIS — O99212 Obesity complicating pregnancy, second trimester: Secondary | ICD-10-CM

## 2016-08-05 MED ORDER — LABETALOL HCL 200 MG PO TABS: 400.0000 mg | ORAL_TABLET | Freq: Two times a day (BID) | ORAL | 3 refills | Status: DC

## 2016-08-05 NOTE — Progress Notes (Signed)
Subjective:  Misty Hodge is a 40 y.o. G3P0110 at [redacted]w[redacted]d being seen today for ongoing prenatal care.  She is currently monitored for the following issues for this high-risk pregnancy and has VENEREAL WART; HEADACHE, TENSION; WEIGHT GAIN; Obesity (BMI 30-39.9); Essential hypertension; AMA (advanced maternal age) primigravida 8+; Zika exposure near conception; Elevated transaminase level; IUFD (intrauterine fetal death); Supervision of high risk pregnancy, antepartum; and Chronic hypertension during pregnancy, antepartum on her problem list.  Patient reports no complaints.  Contractions: Not present. Vag. Bleeding: None.  Movement: Present. Denies leaking of fluid.   The following portions of the patient's history were reviewed and updated as appropriate: allergies, current medications, past family history, past medical history, past social history, past surgical history and problem list. Problem list updated.  Objective:   Vitals:   08/05/16 1514  BP: 138/88  Pulse: (!) 119  Weight: 232 lb (105.2 kg)    Fetal Status:     Movement: Present     General:  Alert, oriented and cooperative. Patient is in no acute distress.  Skin: Skin is warm and dry. No rash noted.   Cardiovascular: Normal heart rate noted  Respiratory: Normal respiratory effort, no problems with respiration noted  Abdomen: Soft, gravid, appropriate for gestational age. Pain/Pressure: Absent     Pelvic:  Cervical exam deferred        Extremities: Normal range of motion.  Edema: Trace  Mental Status: Normal mood and affect. Normal behavior. Normal judgment and thought content.   Urinalysis:      Assessment and Plan:  Pregnancy: G3P0110 at [redacted]w[redacted]d  1. Normal pregnancy in second trimester  - Glucose Tolerance, 1 HR (50g)  2. AMA (advanced maternal age) primigravida 10+, third trimester - normal NIPS  3. Chronic hypertension during pregnancy, antepartum - on baby asa - she is very concerned about her BP. I will  increase her dose from 200 mg BID to 400 mg BID  4. Obesity (BMI 30-39.9) -early glucola today  5. Supervision of high risk pregnancy, antepartum, third trimester - anatomy u/s next week  Preterm labor symptoms and general obstetric precautions including but not limited to vaginal bleeding, contractions, leaking of fluid and fetal movement were reviewed in detail with the patient. Please refer to After Visit Summary for other counseling recommendations.  No Follow-up on file.   Emily Filbert, MD

## 2016-08-06 ENCOUNTER — Telehealth: Payer: Self-pay | Admitting: *Deleted

## 2016-08-06 DIAGNOSIS — O0993 Supervision of high risk pregnancy, unspecified, third trimester: Secondary | ICD-10-CM

## 2016-08-06 LAB — GLUCOSE TOLERANCE, 1 HOUR (50G) W/O FASTING: Glucose, 1 Hr, gestational: 123 mg/dL (ref <140)

## 2016-08-06 NOTE — Telephone Encounter (Signed)
LM on voicemail of normal 1 hr GTT. 

## 2016-08-09 ENCOUNTER — Ambulatory Visit (HOSPITAL_COMMUNITY): Payer: PRIVATE HEALTH INSURANCE

## 2016-08-12 ENCOUNTER — Ambulatory Visit (HOSPITAL_COMMUNITY)
Admission: RE | Admit: 2016-08-12 | Discharge: 2016-08-12 | Disposition: A | Discharge status: Home / Self Care | Payer: PRIVATE HEALTH INSURANCE | Source: Ambulatory Visit | Attending: Obstetrics & Gynecology | Admitting: Obstetrics & Gynecology

## 2016-08-12 ENCOUNTER — Encounter (HOSPITAL_COMMUNITY): Payer: Self-pay

## 2016-08-12 DIAGNOSIS — O112 Pre-existing hypertension with pre-eclampsia, second trimester: Secondary | ICD-10-CM | POA: Diagnosis not present

## 2016-08-12 DIAGNOSIS — O09529 Supervision of elderly multigravida, unspecified trimester: Secondary | ICD-10-CM

## 2016-08-12 DIAGNOSIS — Z3A20 20 weeks gestation of pregnancy: Secondary | ICD-10-CM | POA: Insufficient documentation

## 2016-08-12 DIAGNOSIS — O09292 Supervision of pregnancy with other poor reproductive or obstetric history, second trimester: Secondary | ICD-10-CM | POA: Insufficient documentation

## 2016-08-12 DIAGNOSIS — O09522 Supervision of elderly multigravida, second trimester: Secondary | ICD-10-CM | POA: Diagnosis not present

## 2016-08-12 DIAGNOSIS — Z36 Encounter for antenatal screening of mother: Secondary | ICD-10-CM | POA: Insufficient documentation

## 2016-08-13 ENCOUNTER — Other Ambulatory Visit (HOSPITAL_COMMUNITY): Payer: Self-pay | Admitting: *Deleted

## 2016-08-13 DIAGNOSIS — O10919 Unspecified pre-existing hypertension complicating pregnancy, unspecified trimester: Secondary | ICD-10-CM

## 2016-08-13 LAB — ALPHA FETOPROTEIN, MATERNAL
AFP: 86.7 ng/mL
Curr Gest Age: 19.6 weeks
MOM FOR AFP: 2.19
OPEN SPINA BIFIDA: NEGATIVE
Osb Risk: 1:482 {titer}

## 2016-09-05 ENCOUNTER — Ambulatory Visit (INDEPENDENT_AMBULATORY_CARE_PROVIDER_SITE_OTHER): Payer: PRIVATE HEALTH INSURANCE | Admitting: Obstetrics & Gynecology

## 2016-09-05 VITALS — BP 130/79 | HR 103 | Wt 236.0 lb

## 2016-09-05 DIAGNOSIS — O10912 Unspecified pre-existing hypertension complicating pregnancy, second trimester: Secondary | ICD-10-CM

## 2016-09-05 DIAGNOSIS — O09512 Supervision of elderly primigravida, second trimester: Secondary | ICD-10-CM

## 2016-09-05 DIAGNOSIS — O10919 Unspecified pre-existing hypertension complicating pregnancy, unspecified trimester: Secondary | ICD-10-CM

## 2016-09-06 ENCOUNTER — Encounter: Payer: Self-pay | Admitting: Obstetrics & Gynecology

## 2016-09-06 ENCOUNTER — Other Ambulatory Visit (HOSPITAL_COMMUNITY)
Admission: RE | Admit: 2016-09-06 | Discharge: 2016-09-06 | Disposition: A | Discharge status: Home / Self Care | Payer: PRIVATE HEALTH INSURANCE | Source: Ambulatory Visit | Attending: Obstetrics & Gynecology | Admitting: Obstetrics & Gynecology

## 2016-09-06 DIAGNOSIS — I10 Essential (primary) hypertension: Secondary | ICD-10-CM | POA: Diagnosis not present

## 2016-09-06 DIAGNOSIS — R809 Proteinuria, unspecified: Secondary | ICD-10-CM | POA: Diagnosis present

## 2016-09-06 LAB — COMPREHENSIVE METABOLIC PANEL
ALT: 27 U/L (ref 14–54)
ANION GAP: 8 (ref 5–15)
AST: 25 U/L (ref 15–41)
Albumin: 3.3 g/dL — ABNORMAL LOW (ref 3.5–5.0)
Alkaline Phosphatase: 61 U/L (ref 38–126)
BILIRUBIN TOTAL: 0.4 mg/dL (ref 0.3–1.2)
BUN: 12 mg/dL (ref 6–20)
CO2: 22 mmol/L (ref 22–32)
Calcium: 9.7 mg/dL (ref 8.9–10.3)
Chloride: 105 mmol/L (ref 101–111)
Creatinine, Ser: 0.76 mg/dL (ref 0.44–1.00)
Glucose, Bld: 93 mg/dL (ref 65–99)
POTASSIUM: 4 mmol/L (ref 3.5–5.1)
Sodium: 135 mmol/L (ref 135–145)
TOTAL PROTEIN: 7.1 g/dL (ref 6.5–8.1)

## 2016-09-06 LAB — CBC
HCT: 34 % — ABNORMAL LOW (ref 36.0–46.0)
Hemoglobin: 11.5 g/dL — ABNORMAL LOW (ref 12.0–15.0)
MCH: 30 pg (ref 26.0–34.0)
MCHC: 33.8 g/dL (ref 30.0–36.0)
MCV: 88.8 fL (ref 78.0–100.0)
Platelets: 319 10*3/uL (ref 150–400)
RBC: 3.83 MIL/uL — ABNORMAL LOW (ref 3.87–5.11)
RDW: 14 % (ref 11.5–15.5)
WBC: 10.1 10*3/uL (ref 4.0–10.5)

## 2016-09-06 NOTE — Progress Notes (Signed)
   PRENATAL VISIT NOTE  Subjective:  Misty Hodge is a 40 y.o. G3P0110 at [redacted]w[redacted]d being seen today for ongoing prenatal care.  She is currently monitored for the following issues for this high-risk pregnancy and has VENEREAL WART; HEADACHE, TENSION; WEIGHT GAIN; Obesity (BMI 30-39.9); Essential hypertension; AMA (advanced maternal age) primigravida 83+; Zika exposure near conception; Elevated transaminase level; IUFD (intrauterine fetal death); Supervision of high risk pregnancy, antepartum; and Chronic hypertension during pregnancy, antepartum on her problem list.  Patient reports no complaints.  Contractions: Not present. Vag. Bleeding: None.  Movement: Present. Denies leaking of fluid.   The following portions of the patient's history were reviewed and updated as appropriate: allergies, current medications, past family history, past medical history, past social history, past surgical history and problem list. Problem list updated.  Objective:   Vitals:   09/05/16 1538  BP: 130/79  Pulse: (!) 103  Weight: 236 lb (107 kg)    Fetal Status: Fetal Heart Rate (bpm): 157 Fundal Height: 24 cm Movement: Present     General:  Alert, oriented and cooperative. Patient is in no acute distress.  Skin: Skin is warm and dry. No rash noted.   Cardiovascular: Normal heart rate noted  Respiratory: Normal respiratory effort, no problems with respiration noted  Abdomen: Soft, gravid, appropriate for gestational age. Pain/Pressure: Absent     Pelvic:  Cervical exam deferred        Extremities: Normal range of motion.  Edema: Trace  Mental Status: Normal mood and affect. Normal behavior. Normal judgment and thought content.   Urinalysis: Urine Protein: 1+ Urine Glucose: Negative  Assessment and Plan:  Pregnancy: G3P0110 at [redacted]w[redacted]d  1. Chronic hypertension during pregnancy, antepartum -check Urine culture due to protein. -Pt to take BP at work and call with elevations -continue labetalol -pt  called on Friday to come in for outpatient labs at [facility] on Friday given the new proteinuria.  Pt agrees to come to [facility] for labs.  Preterm labor symptoms and general obstetric precautions including but not limited to vaginal bleeding, contractions, leaking of fluid and fetal movement were reviewed in detail with the patient. Please refer to After Visit Summary for other counseling recommendations.  Return in about 2 weeks (around 09/19/2016).  Guss Bunde, MD

## 2016-09-10 ENCOUNTER — Encounter (HOSPITAL_COMMUNITY): Payer: Self-pay

## 2016-09-10 ENCOUNTER — Ambulatory Visit (HOSPITAL_COMMUNITY)
Admission: RE | Admit: 2016-09-10 | Discharge: 2016-09-10 | Disposition: A | Discharge status: Home / Self Care | Payer: PRIVATE HEALTH INSURANCE | Source: Ambulatory Visit | Attending: Obstetrics & Gynecology | Admitting: Obstetrics & Gynecology

## 2016-09-10 VITALS — BP 118/77 | HR 101 | Wt 238.0 lb

## 2016-09-10 DIAGNOSIS — O09522 Supervision of elderly multigravida, second trimester: Secondary | ICD-10-CM | POA: Insufficient documentation

## 2016-09-10 DIAGNOSIS — O99212 Obesity complicating pregnancy, second trimester: Secondary | ICD-10-CM | POA: Diagnosis not present

## 2016-09-10 DIAGNOSIS — O09292 Supervision of pregnancy with other poor reproductive or obstetric history, second trimester: Secondary | ICD-10-CM | POA: Diagnosis not present

## 2016-09-10 DIAGNOSIS — IMO0002 Reserved for concepts with insufficient information to code with codable children: Secondary | ICD-10-CM

## 2016-09-10 DIAGNOSIS — Z3A24 24 weeks gestation of pregnancy: Secondary | ICD-10-CM | POA: Diagnosis not present

## 2016-09-10 DIAGNOSIS — O10919 Unspecified pre-existing hypertension complicating pregnancy, unspecified trimester: Secondary | ICD-10-CM

## 2016-09-10 DIAGNOSIS — O09519 Supervision of elderly primigravida, unspecified trimester: Secondary | ICD-10-CM

## 2016-09-10 DIAGNOSIS — O10012 Pre-existing essential hypertension complicating pregnancy, second trimester: Secondary | ICD-10-CM | POA: Insufficient documentation

## 2016-09-20 ENCOUNTER — Ambulatory Visit (INDEPENDENT_AMBULATORY_CARE_PROVIDER_SITE_OTHER): Payer: PRIVATE HEALTH INSURANCE | Admitting: Obstetrics and Gynecology

## 2016-09-20 ENCOUNTER — Encounter: Payer: Self-pay | Admitting: Obstetrics and Gynecology

## 2016-09-20 VITALS — BP 127/80 | HR 99 | Wt 239.0 lb

## 2016-09-20 DIAGNOSIS — O10912 Unspecified pre-existing hypertension complicating pregnancy, second trimester: Secondary | ICD-10-CM

## 2016-09-20 DIAGNOSIS — O10919 Unspecified pre-existing hypertension complicating pregnancy, unspecified trimester: Secondary | ICD-10-CM

## 2016-09-20 NOTE — Progress Notes (Signed)
   PRENATAL VISIT NOTE  Subjective:  Misty Hodge is a 40 y.o. G3P0110 at [redacted]w[redacted]d being seen today for ongoing prenatal care.  She is currently monitored for the following issues for this high-risk pregnancy and has VENEREAL WART; HEADACHE, TENSION; WEIGHT GAIN; Obesity (BMI 30-39.9); Essential hypertension; AMA (advanced maternal age) primigravida 43+; Zika exposure near conception; Elevated transaminase level; IUFD (intrauterine fetal death); Supervision of high risk pregnancy, antepartum; and Chronic hypertension during pregnancy, antepartum on her problem list.  Patient reports no complaints.  Contractions: Not present. Vag. Bleeding: None.  Movement: Present. Denies leaking of fluid. Working in [employer]. Plans breastfeeding.  The following portions of the patient's history were reviewed and updated as appropriate: allergies, current medications, past family history, past medical history, past social history, past surgical history and problem list. Problem list updated.  Objective:   Vitals:   09/20/16 1105  BP: 127/80  Pulse: 99  Weight: 239 lb (108.4 kg)    Fetal Status: Fetal Heart Rate (bpm): 153   Movement: Present     General:  Alert, oriented and cooperative. Patient is in no acute distress.  Skin: Skin is warm and dry. No rash noted.   Cardiovascular: Normal heart rate noted  Respiratory: Normal respiratory effort, no problems with respiration noted  Abdomen: Soft, gravid, appropriate for gestational age. Pain/Pressure: Present     Pelvic:  Cervical exam deferred        Extremities: Normal range of motion.  Edema: Mild pitting, slight indentation  Mental Status: Normal mood and affect. Normal behavior. Normal judgment and thought content.   Urinalysis: Urine Protein: Negative Urine Glucose: Negative  Assessment and Plan:  Pregnancy: G3P0110 at [redacted]w[redacted]d  1. Chronic hypertension during pregnancy, antepartum Well-controlled on labetalol 400mg  bid  Preterm labor  symptoms and general obstetric precautions including but not limited to vaginal bleeding, contractions, leaking of fluid and fetal movement were reviewed in detail with the patient. Please refer to After Visit Summary for other counseling recommendations. Continue labetalol.  [place] 10/08/16 and serially Return in about 2 weeks (around 10/04/2016).  Lorene Dy, CNM

## 2016-09-20 NOTE — Patient Instructions (Addendum)
Vaginal Bleeding During Pregnancy, Second Trimester A small amount of bleeding (spotting) from the vagina is relatively common in pregnancy. It usually stops on its own. Various things can cause bleeding or spotting in pregnancy. Some bleeding may be related to the pregnancy, and some may not. Sometimes the bleeding is normal and is not a problem. However, bleeding can also be a sign of something serious. Be sure to tell your health care provider about any vaginal bleeding right away. Some possible causes of vaginal bleeding during the second trimester include:  Infection, inflammation, or growths on the cervix.   The placenta may be partially or completely covering the opening of the cervix inside the uterus (placenta previa).  The placenta may have separated from the uterus (abruption of the placenta).   You may be having early (preterm) labor.   The cervix may not be strong enough to keep a baby inside the uterus (cervical insufficiency).   Tiny cysts may have developed in the uterus instead of pregnancy tissue (molar pregnancy). HOME CARE INSTRUCTIONS  Watch your condition for any changes. The following actions may help to lessen any discomfort you are feeling:  Follow your health care provider's instructions for limiting your activity. If your health care provider orders bed rest, you may need to stay in bed and only get up to use the bathroom. However, your health care provider may allow you to continue light activity.  If needed, make plans for someone to help with your regular activities and responsibilities while you are on bed rest.  Keep track of the number of pads you use each day, how often you change pads, and how soaked (saturated) they are. Write this down.  Do not use tampons. Do not douche.  Do not have sexual intercourse or orgasms until approved by your health care provider.  If you pass any tissue from your vagina, save the tissue so you can show it to your  health care provider.  Only take over-the-counter or prescription medicines as directed by your health care provider.  Do not take aspirin because it can make you bleed.  Do not exercise or perform any strenuous activities or heavy lifting without your health care provider's permission.  Keep all follow-up appointments as directed by your health care provider. SEEK MEDICAL CARE IF:  You have any vaginal bleeding during any part of your pregnancy.  You have cramps or labor pains.  You have a fever, not controlled by medicine. SEEK IMMEDIATE MEDICAL CARE IF:   You have severe cramps in your back or belly (abdomen).  You have contractions.  You have chills.  You pass large clots or tissue from your vagina.  Your bleeding increases.  You feel light-headed or weak, or you have fainting episodes.  You are leaking fluid or have a gush of fluid from your vagina. MAKE SURE YOU:  Understand these instructions.  Will watch your condition.  Will get help right away if you are not doing well or get worse.   This information is not intended to replace advice given to you by your health care provider. Make sure you discuss any questions you have with your health care provider.   Document Released: 09/25/2005 Document Revised: 12/21/2013 Document Reviewed: 08/23/2013 Elsevier Interactive Patient Education 2016 [place] of Pregnancy The second trimester is from week 13 through week 28, months 4 through 6. The second trimester is often a time when you feel your best. Your body has also  adjusted to being pregnant, and you begin to feel better physically. Usually, morning sickness has lessened or quit completely, you may have more energy, and you may have an increase in appetite. The second trimester is also a time when the fetus is growing rapidly. At the end of the sixth month, the fetus is about 9 inches long and weighs about 1 pounds. You will likely begin to  feel the baby move (quickening) between 18 and 20 weeks of the pregnancy. BODY CHANGES Your body goes through many changes during pregnancy. The changes vary from woman to woman.   Your weight will continue to increase. You will notice your lower abdomen bulging out.  You may begin to get stretch marks on your hips, abdomen, and breasts.  You may develop headaches that can be relieved by medicines approved by your health care provider.  You may urinate more often because the fetus is pressing on your bladder.  You may develop or continue to have heartburn as a result of your pregnancy.  You may develop constipation because certain hormones are causing the muscles that push waste through your intestines to slow down.  You may develop hemorrhoids or swollen, bulging veins (varicose veins).  You may have back pain because of the weight gain and pregnancy hormones relaxing your joints between the bones in your pelvis and as a result of a shift in weight and the muscles that support your balance.  Your breasts will continue to grow and be tender.  Your gums may bleed and may be sensitive to brushing and flossing.  Dark spots or blotches (chloasma, mask of pregnancy) may develop on your face. This will likely fade after the baby is born.  A dark line from your belly button to the pubic area (linea nigra) may appear. This will likely fade after the baby is born.  You may have changes in your hair. These can include thickening of your hair, rapid growth, and changes in texture. Some women also have hair loss during or after pregnancy, or hair that feels dry or thin. Your hair will most likely return to normal after your baby is born. WHAT TO EXPECT AT YOUR PRENATAL VISITS During a routine prenatal visit:  You will be weighed to make sure you and the fetus are growing normally.  Your blood pressure will be taken.  Your abdomen will be measured to track your baby's growth.  The fetal  heartbeat will be listened to.  Any test results from the previous visit will be discussed. Your health care provider may ask you:  How you are feeling.  If you are feeling the baby move.  If you have had any abnormal symptoms, such as leaking fluid, bleeding, severe headaches, or abdominal cramping.  If you are using any tobacco products, including cigarettes, chewing tobacco, and electronic cigarettes.  If you have any questions. Other tests that may be performed during your second trimester include:  Blood tests that check for:  Low iron levels (anemia).  Gestational diabetes (between 24 and 28 weeks).  Rh antibodies.  Urine tests to check for infections, diabetes, or protein in the urine.  An ultrasound to confirm the proper growth and development of the baby.  An amniocentesis to check for possible genetic problems.  Fetal screens for spina bifida and Down syndrome.  HIV (human immunodeficiency virus) testing. Routine prenatal testing includes screening for HIV, unless you choose not to have this test. HOME CARE INSTRUCTIONS   Avoid all smoking, herbs,  alcohol, and unprescribed drugs. These chemicals affect the formation and growth of the baby.  Do not use any tobacco products, including cigarettes, chewing tobacco, and electronic cigarettes. If you need help quitting, ask your health care provider. You may receive counseling support and other resources to help you quit.  Follow your health care provider's instructions regarding medicine use. There are medicines that are either safe or unsafe to take during pregnancy.  Exercise only as directed by your health care provider. Experiencing uterine cramps is a good sign to stop exercising.  Continue to eat regular, healthy meals.  Wear a good support bra for breast tenderness.  Do not use hot tubs, steam rooms, or saunas.  Wear your seat belt at all times when driving.  Avoid raw meat, uncooked cheese, cat litter  boxes, and soil used by cats. These carry germs that can cause birth defects in the baby.  Take your prenatal vitamins.  Take 1500-2000 mg of calcium daily starting at the 20th week of pregnancy until you deliver your baby.  Try taking a stool softener (if your health care provider approves) if you develop constipation. Eat more high-fiber foods, such as fresh vegetables or fruit and whole grains. Drink plenty of fluids to keep your urine clear or pale yellow.  Take warm sitz baths to soothe any pain or discomfort caused by hemorrhoids. Use hemorrhoid cream if your health care provider approves.  If you develop varicose veins, wear support hose. Elevate your feet for 15 minutes, 3-4 times a day. Limit salt in your diet.  Avoid heavy lifting, wear low heel shoes, and practice good posture.  Rest with your legs elevated if you have leg cramps or low back pain.  Visit your dentist if you have not gone yet during your pregnancy. Use a soft toothbrush to brush your teeth and be gentle when you floss.  A sexual relationship may be continued unless your health care provider directs you otherwise.  Continue to go to all your prenatal visits as directed by your health care provider. SEEK MEDICAL CARE IF:   You have dizziness.  You have mild pelvic cramps, pelvic pressure, or nagging pain in the abdominal area.  You have persistent nausea, vomiting, or diarrhea.  You have a bad smelling vaginal discharge.  You have pain with urination. SEEK IMMEDIATE MEDICAL CARE IF:   You have a fever.  You are leaking fluid from your vagina.  You have spotting or bleeding from your vagina.  You have severe abdominal cramping or pain.  You have rapid weight gain or loss.  You have shortness of breath with chest pain.  You notice sudden or extreme swelling of your face, hands, ankles, feet, or legs.  You have not felt your baby move in over an hour.  You have severe headaches that do not go  away with medicine.  You have vision changes.   This information is not intended to replace advice given to you by your health care provider. Make sure you discuss any questions you have with your health care provider.   Document Released: 12/10/2001 Document Revised: 01/06/2015 Document Reviewed: 02/16/2013 Elsevier Interactive Patient Education [employer].

## 2016-10-03 ENCOUNTER — Ambulatory Visit (INDEPENDENT_AMBULATORY_CARE_PROVIDER_SITE_OTHER): Payer: PRIVATE HEALTH INSURANCE | Admitting: Obstetrics & Gynecology

## 2016-10-03 VITALS — BP 134/80 | HR 107 | Wt 239.0 lb

## 2016-10-03 DIAGNOSIS — O0990 Supervision of high risk pregnancy, unspecified, unspecified trimester: Secondary | ICD-10-CM

## 2016-10-03 DIAGNOSIS — O0993 Supervision of high risk pregnancy, unspecified, third trimester: Secondary | ICD-10-CM

## 2016-10-03 NOTE — Progress Notes (Signed)
   PRENATAL VISIT NOTE  Subjective:  Misty Hodge is a 40 y.o. G3P0110 at [redacted]w[redacted]d being seen today for ongoing prenatal care.  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  Patient reports pedal edema.  Contractions: Not present. Vag. Bleeding: None.  Movement: Present. Denies leaking of fluid.   The following portions of the patient's history were reviewed and updated as appropriate: allergies, current medications, past family history, past medical history, past social history, past surgical history and problem list. Problem list updated.  Objective:   Vitals:   10/03/16 1410  BP: 134/80  Pulse: (!) 107  Weight: 239 lb (108.4 kg)    Fetal Status:   Fundal Height: 27 cm Movement: Present     General:  Alert, oriented and cooperative. Patient is in no acute distress.  Skin: Skin is warm and dry. No rash noted.   Cardiovascular: Normal heart rate noted  Respiratory: Normal respiratory effort, no problems with respiration noted  Abdomen: Soft, gravid, appropriate for gestational age. Pain/Pressure: Present     Pelvic:  Cervical exam deferred        Extremities: Normal range of motion.  Edema: Mild pitting, slight indentation  Mental Status: Normal mood and affect. Normal behavior. Normal judgment and thought content.   Urinalysis:      Assessment and Plan:  Pregnancy: G3P0110 at [redacted]w[redacted]d  1. Supervision of high risk pregnancy, antepartum -Growth [place] on 10/11 -2x week testing at 32 weeks -Tdap, 28 week labs  Preterm labor symptoms and general obstetric precautions including but not  limited to vaginal bleeding, contractions, leaking of fluid and fetal movement were reviewed in detail with the patient. Please refer to After Visit Summary for other counseling recommendations.  Return in about 2 weeks (around 10/17/2016).  Guss Bunde, MD

## 2016-10-03 NOTE — Addendum Note (Signed)
Addended by: Gretchen Short on: 10/03/2016 03:10 PM   Modules accepted: Orders

## 2016-10-04 LAB — CBC
HEMATOCRIT: 33.9 % — AB (ref 35.0–45.0)
HEMOGLOBIN: 10.8 g/dL — AB (ref 11.7–15.5)
MCH: 29.2 pg (ref 27.0–33.0)
MCHC: 31.9 g/dL — AB (ref 32.0–36.0)
MCV: 91.6 fL (ref 80.0–100.0)
MPV: 8.9 fL (ref 7.5–12.5)
Platelets: 330 10*3/uL (ref 140–400)
RBC: 3.7 MIL/uL — ABNORMAL LOW (ref 3.80–5.10)
RDW: 14.2 % (ref 11.0–15.0)
WBC: 7.7 10*3/uL (ref 3.8–10.8)

## 2016-10-04 LAB — HIV ANTIBODY (ROUTINE TESTING W REFLEX): HIV 1&2 Ab, 4th Generation: NONREACTIVE

## 2016-10-04 LAB — GLUCOSE TOLERANCE, 1 HOUR (50G) W/O FASTING: Glucose, 1 Hr, gestational: 131 mg/dL (ref <140)

## 2016-10-05 LAB — RPR

## 2016-10-07 ENCOUNTER — Other Ambulatory Visit: Payer: Self-pay | Admitting: Obstetrics & Gynecology

## 2016-10-07 DIAGNOSIS — I10 Essential (primary) hypertension: Secondary | ICD-10-CM

## 2016-10-08 ENCOUNTER — Other Ambulatory Visit: Payer: Self-pay | Admitting: *Deleted

## 2016-10-08 DIAGNOSIS — I10 Essential (primary) hypertension: Secondary | ICD-10-CM

## 2016-10-08 MED ORDER — LABETALOL HCL 200 MG PO TABS: 400.0000 mg | ORAL_TABLET | Freq: Two times a day (BID) | ORAL | 3 refills | Status: DC

## 2016-10-09 ENCOUNTER — Other Ambulatory Visit (HOSPITAL_COMMUNITY): Payer: Self-pay | Admitting: Maternal and Fetal Medicine

## 2016-10-09 ENCOUNTER — Ambulatory Visit (HOSPITAL_COMMUNITY)
Admission: RE | Admit: 2016-10-09 | Discharge: 2016-10-09 | Disposition: A | Discharge status: Home / Self Care | Payer: PRIVATE HEALTH INSURANCE | Source: Ambulatory Visit | Attending: Obstetrics & Gynecology | Admitting: Obstetrics & Gynecology

## 2016-10-09 ENCOUNTER — Encounter (HOSPITAL_COMMUNITY): Payer: Self-pay

## 2016-10-09 DIAGNOSIS — O99213 Obesity complicating pregnancy, third trimester: Secondary | ICD-10-CM | POA: Insufficient documentation

## 2016-10-09 DIAGNOSIS — O09523 Supervision of elderly multigravida, third trimester: Secondary | ICD-10-CM | POA: Diagnosis present

## 2016-10-09 DIAGNOSIS — O09519 Supervision of elderly primigravida, unspecified trimester: Secondary | ICD-10-CM

## 2016-10-09 DIAGNOSIS — O10013 Pre-existing essential hypertension complicating pregnancy, third trimester: Secondary | ICD-10-CM | POA: Diagnosis not present

## 2016-10-09 DIAGNOSIS — IMO0002 Reserved for concepts with insufficient information to code with codable children: Secondary | ICD-10-CM

## 2016-10-09 DIAGNOSIS — O10919 Unspecified pre-existing hypertension complicating pregnancy, unspecified trimester: Secondary | ICD-10-CM

## 2016-10-09 DIAGNOSIS — O09293 Supervision of pregnancy with other poor reproductive or obstetric history, third trimester: Secondary | ICD-10-CM | POA: Insufficient documentation

## 2016-10-09 DIAGNOSIS — Z3A28 28 weeks gestation of pregnancy: Secondary | ICD-10-CM | POA: Insufficient documentation

## 2016-10-10 ENCOUNTER — Other Ambulatory Visit (HOSPITAL_COMMUNITY): Payer: Self-pay | Admitting: *Deleted

## 2016-10-10 DIAGNOSIS — IMO0002 Reserved for concepts with insufficient information to code with codable children: Secondary | ICD-10-CM

## 2016-10-17 ENCOUNTER — Ambulatory Visit (INDEPENDENT_AMBULATORY_CARE_PROVIDER_SITE_OTHER): Payer: PRIVATE HEALTH INSURANCE | Admitting: Obstetrics & Gynecology

## 2016-10-17 VITALS — BP 129/78 | HR 96 | Wt 243.0 lb

## 2016-10-17 DIAGNOSIS — O09513 Supervision of elderly primigravida, third trimester: Secondary | ICD-10-CM | POA: Diagnosis not present

## 2016-10-17 DIAGNOSIS — O10913 Unspecified pre-existing hypertension complicating pregnancy, third trimester: Secondary | ICD-10-CM

## 2016-10-17 DIAGNOSIS — O09519 Supervision of elderly primigravida, unspecified trimester: Secondary | ICD-10-CM

## 2016-10-17 DIAGNOSIS — O10919 Unspecified pre-existing hypertension complicating pregnancy, unspecified trimester: Secondary | ICD-10-CM

## 2016-10-17 DIAGNOSIS — O0990 Supervision of high risk pregnancy, unspecified, unspecified trimester: Secondary | ICD-10-CM

## 2016-10-17 NOTE — Progress Notes (Signed)
   PRENATAL VISIT NOTE  Subjective:  Misty Hodge is a 40 y.o. G3P0110 at [redacted]w[redacted]d being seen today for ongoing prenatal care.  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  Patient reports somewhat decreased FM.  Contractions: Not present. Vag. Bleeding: None.  Movement: Present. Denies leaking of fluid.   The following portions of the patient's history were reviewed and updated as appropriate: allergies, current medications, past family history, past medical history, past social history, past surgical history and problem list. Problem list updated.  Objective:   Vitals:   10/17/16 1555  BP: 140/85  Pulse: 96  Weight: 243 lb (110.2 kg)    Fetal Status: Fetal Heart Rate (bpm): 153   Movement: Present     General:  Alert, oriented and cooperative. Patient is in no acute distress.  Skin: Skin is warm and dry. No rash noted.   Cardiovascular: Normal heart rate noted  Respiratory: Normal respiratory effort, no problems with respiration noted  Abdomen: Soft, gravid, appropriate for gestational age. Pain/Pressure: Absent     Pelvic:  Cervical exam deferred        Extremities: Normal range of motion.  Edema: Mild pitting, slight indentation  Mental Status: Normal mood and affect. Normal behavior. Normal judgment and thought content.   Assessment and Plan:  Pregnancy: G3P0110 at [redacted]w[redacted]d  1. Advanced maternal age, primigravida, antepartum - Delivery by [facility]  2. Chronic hypertension during pregnancy, antepartum - Baby asa daily  3. Supervision of high risk pregnancy, antepartum - With  decreased FM today, I will get an NST. - She will get her flu vaccine next week at work [facility]  Preterm labor symptoms and general obstetric precautions including but not limited to vaginal bleeding, contractions, leaking of fluid and fetal movement were reviewed in detail with the patient. Please refer to After Visit Summary for other counseling recommendations.  No Follow-up on file.  Emily Filbert, MD

## 2016-10-29 ENCOUNTER — Ambulatory Visit (INDEPENDENT_AMBULATORY_CARE_PROVIDER_SITE_OTHER): Payer: PRIVATE HEALTH INSURANCE | Admitting: Obstetrics & Gynecology

## 2016-10-29 VITALS — BP 147/86 | HR 110 | Wt 245.0 lb

## 2016-10-29 DIAGNOSIS — O10919 Unspecified pre-existing hypertension complicating pregnancy, unspecified trimester: Secondary | ICD-10-CM

## 2016-10-29 DIAGNOSIS — E669 Obesity, unspecified: Secondary | ICD-10-CM

## 2016-10-29 DIAGNOSIS — O09513 Supervision of elderly primigravida, third trimester: Secondary | ICD-10-CM

## 2016-10-29 DIAGNOSIS — A6009 Herpesviral infection of other urogenital tract: Secondary | ICD-10-CM | POA: Diagnosis not present

## 2016-10-29 DIAGNOSIS — O99213 Obesity complicating pregnancy, third trimester: Secondary | ICD-10-CM

## 2016-10-29 DIAGNOSIS — O0990 Supervision of high risk pregnancy, unspecified, unspecified trimester: Secondary | ICD-10-CM

## 2016-10-29 DIAGNOSIS — O10913 Unspecified pre-existing hypertension complicating pregnancy, third trimester: Secondary | ICD-10-CM | POA: Diagnosis not present

## 2016-10-29 DIAGNOSIS — O09523 Supervision of elderly multigravida, third trimester: Secondary | ICD-10-CM

## 2016-10-29 DIAGNOSIS — O98313 Other infections with a predominantly sexual mode of transmission complicating pregnancy, third trimester: Secondary | ICD-10-CM | POA: Diagnosis not present

## 2016-10-29 DIAGNOSIS — O113 Pre-existing hypertension with pre-eclampsia, third trimester: Secondary | ICD-10-CM | POA: Diagnosis not present

## 2016-10-29 NOTE — Progress Notes (Signed)
   PRENATAL VISIT NOTE  Subjective:  Misty Hodge is a 40 y.o. G3P0110 at [redacted]w[redacted]d being seen today for ongoing prenatal care.  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  Patient reports no complaints.   .  .   . Denies leaking of fluid.   The following portions of the patient's history were reviewed and updated as appropriate: allergies, current medications, past family history, past medical history, past social history, past surgical history and problem list. Problem list updated.  Objective:  There were no vitals filed for this visit.  Fetal Status:           General:  Alert, oriented and cooperative. Patient is in no acute distress.  Skin: Skin is warm and dry. No rash noted.   Cardiovascular: Normal heart rate noted  Respiratory: Normal respiratory effort, no problems with respiration noted  Abdomen: Soft, gravid, appropriate for gestational age.       Pelvic:  Cervical exam deferred        Extremities: Normal range of motion.     Mental Status: Normal mood and affect. Normal behavior. Normal judgment and thought content.   Assessment and Plan:  Pregnancy: G3P0110 at [redacted]w[redacted]d  1. Supervision of high risk pregnancy, antepartum   2. Obesity (BMI 30-39.9)   3. Elderly primigravida in third trimester   4. Chronic hypertension during pregnancy, antepartum - on baby asa, continue twice weekly testing  Preterm labor symptoms and general obstetric precautions including but not limited to vaginal bleeding, contractions, leaking of fluid and fetal movement were reviewed in detail with  the patient. Please refer to After Visit Summary for other counseling recommendations.  No Follow-up on file.  Emily Filbert, MD

## 2016-10-30 ENCOUNTER — Other Ambulatory Visit (HOSPITAL_COMMUNITY): Payer: Self-pay | Admitting: Maternal and Fetal Medicine

## 2016-10-30 ENCOUNTER — Ambulatory Visit (HOSPITAL_COMMUNITY)
Admission: RE | Admit: 2016-10-30 | Discharge: 2016-10-30 | Disposition: A | Discharge status: Home / Self Care | Payer: PRIVATE HEALTH INSURANCE | Source: Ambulatory Visit | Attending: Obstetrics & Gynecology | Admitting: Obstetrics & Gynecology

## 2016-10-30 ENCOUNTER — Encounter (HOSPITAL_COMMUNITY): Payer: Self-pay

## 2016-10-30 DIAGNOSIS — IMO0002 Reserved for concepts with insufficient information to code with codable children: Secondary | ICD-10-CM

## 2016-10-30 DIAGNOSIS — O09523 Supervision of elderly multigravida, third trimester: Secondary | ICD-10-CM | POA: Diagnosis present

## 2016-10-30 DIAGNOSIS — Z3A31 31 weeks gestation of pregnancy: Secondary | ICD-10-CM | POA: Diagnosis not present

## 2016-10-30 DIAGNOSIS — O09293 Supervision of pregnancy with other poor reproductive or obstetric history, third trimester: Secondary | ICD-10-CM | POA: Insufficient documentation

## 2016-10-30 DIAGNOSIS — O10013 Pre-existing essential hypertension complicating pregnancy, third trimester: Secondary | ICD-10-CM | POA: Diagnosis not present

## 2016-10-30 DIAGNOSIS — O99213 Obesity complicating pregnancy, third trimester: Secondary | ICD-10-CM | POA: Diagnosis not present

## 2016-10-31 ENCOUNTER — Encounter: Payer: PRIVATE HEALTH INSURANCE | Admitting: Obstetrics & Gynecology

## 2016-11-01 ENCOUNTER — Other Ambulatory Visit (HOSPITAL_COMMUNITY): Payer: Self-pay | Admitting: *Deleted

## 2016-11-01 DIAGNOSIS — O365930 Maternal care for other known or suspected poor fetal growth, third trimester, not applicable or unspecified: Secondary | ICD-10-CM

## 2016-11-01 NOTE — Progress Notes (Signed)
[place] responded to the outgoing on call CH's request to make a follow-up visit with the Pt in ED03. [place] visited and talked to the Pt, who requested for prayers, which the [facility] provided. Pt seem to be in pain, but was alert and coherent.      11/01/16 1300  Clinical Encounter Type  Visited With Patient  Visit Type Initial  Referral From Chaplain  Consult/Referral To Chaplain  Spiritual Encounters  Spiritual Needs Prayer

## 2016-11-04 ENCOUNTER — Encounter: Payer: PRIVATE HEALTH INSURANCE | Admitting: Obstetrics & Gynecology

## 2016-11-05 ENCOUNTER — Ambulatory Visit (INDEPENDENT_AMBULATORY_CARE_PROVIDER_SITE_OTHER): Payer: PRIVATE HEALTH INSURANCE | Admitting: *Deleted

## 2016-11-05 ENCOUNTER — Encounter: Payer: Self-pay | Admitting: *Deleted

## 2016-11-05 VITALS — BP 135/81 | HR 85 | Wt 244.0 lb

## 2016-11-05 DIAGNOSIS — O10919 Unspecified pre-existing hypertension complicating pregnancy, unspecified trimester: Secondary | ICD-10-CM

## 2016-11-05 DIAGNOSIS — O10913 Unspecified pre-existing hypertension complicating pregnancy, third trimester: Secondary | ICD-10-CM | POA: Diagnosis not present

## 2016-11-06 ENCOUNTER — Other Ambulatory Visit (HOSPITAL_COMMUNITY): Payer: Self-pay | Admitting: Maternal and Fetal Medicine

## 2016-11-06 ENCOUNTER — Encounter (HOSPITAL_COMMUNITY): Payer: Self-pay

## 2016-11-06 ENCOUNTER — Ambulatory Visit (HOSPITAL_COMMUNITY)
Admission: RE | Admit: 2016-11-06 | Discharge: 2016-11-06 | Disposition: A | Discharge status: Home / Self Care | Payer: PRIVATE HEALTH INSURANCE | Source: Ambulatory Visit | Attending: Obstetrics & Gynecology | Admitting: Obstetrics & Gynecology

## 2016-11-06 DIAGNOSIS — O99213 Obesity complicating pregnancy, third trimester: Secondary | ICD-10-CM | POA: Insufficient documentation

## 2016-11-06 DIAGNOSIS — Z3A32 32 weeks gestation of pregnancy: Secondary | ICD-10-CM | POA: Diagnosis not present

## 2016-11-06 DIAGNOSIS — O09523 Supervision of elderly multigravida, third trimester: Secondary | ICD-10-CM | POA: Diagnosis not present

## 2016-11-06 DIAGNOSIS — O365930 Maternal care for other known or suspected poor fetal growth, third trimester, not applicable or unspecified: Secondary | ICD-10-CM | POA: Insufficient documentation

## 2016-11-06 DIAGNOSIS — O09293 Supervision of pregnancy with other poor reproductive or obstetric history, third trimester: Secondary | ICD-10-CM | POA: Insufficient documentation

## 2016-11-06 DIAGNOSIS — O10013 Pre-existing essential hypertension complicating pregnancy, third trimester: Secondary | ICD-10-CM | POA: Insufficient documentation

## 2016-11-07 ENCOUNTER — Ambulatory Visit (INDEPENDENT_AMBULATORY_CARE_PROVIDER_SITE_OTHER): Payer: PRIVATE HEALTH INSURANCE | Admitting: Obstetrics & Gynecology

## 2016-11-07 VITALS — BP 142/84 | HR 80 | Wt 250.0 lb

## 2016-11-07 DIAGNOSIS — O10919 Unspecified pre-existing hypertension complicating pregnancy, unspecified trimester: Secondary | ICD-10-CM

## 2016-11-07 DIAGNOSIS — O99213 Obesity complicating pregnancy, third trimester: Secondary | ICD-10-CM | POA: Diagnosis not present

## 2016-11-07 DIAGNOSIS — O09513 Supervision of elderly primigravida, third trimester: Secondary | ICD-10-CM | POA: Diagnosis not present

## 2016-11-07 DIAGNOSIS — O10913 Unspecified pre-existing hypertension complicating pregnancy, third trimester: Secondary | ICD-10-CM | POA: Diagnosis not present

## 2016-11-07 DIAGNOSIS — E669 Obesity, unspecified: Secondary | ICD-10-CM | POA: Diagnosis not present

## 2016-11-07 DIAGNOSIS — O0990 Supervision of high risk pregnancy, unspecified, unspecified trimester: Secondary | ICD-10-CM

## 2016-11-07 NOTE — Patient Instructions (Signed)

## 2016-11-07 NOTE — Progress Notes (Signed)
   PRENATAL VISIT NOTE  Subjective:  Misty Hodge is a 40 y.o. G3P0110 at [redacted]w[redacted]d being seen today for ongoing prenatal care.  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  Patient reports no complaints.  Contractions: Not present. Vag. Bleeding: None.  Movement: Present. Denies leaking of fluid.   The following portions of the patient's history were reviewed and updated as appropriate: allergies, current medications, past family history, past medical history, past social history, past surgical history and problem list. Problem list updated.  Objective:   Vitals:   11/07/16 1627  BP: (!) 142/84  Pulse: 80  Weight: 250 lb (113.4 kg)    Fetal Status:     Movement: Present     General:  Alert, oriented and cooperative. Patient is in no acute distress.  Skin: Skin is warm and dry. No rash noted.   Cardiovascular: Normal heart rate noted  Respiratory: Normal respiratory effort, no problems with respiration noted  Abdomen: Soft, gravid, appropriate for gestational age. Pain/Pressure: Present     Pelvic:  Cervical exam deferred        Extremities: Normal range of motion.  Edema: Mild pitting, slight indentation  Mental Status: Normal mood and affect. Normal behavior. Normal judgment and thought content.   Assessment and Plan:  Pregnancy: G3P0110 at [redacted]w[redacted]d  1. Supervision of high risk pregnancy, antepartum  2. Obesity (BMI 30-39.9)  3. Chronic hypertension during pregnancy, antepartum BP elevated currently on Labetalol 400mg  bid Will keep on same dose at present  4. Elderly primigravida  in third trimester  5. H/u IUFD  NST reviewed and reactive.   Preterm labor symptoms and general obstetric precautions including but not limited to vaginal bleeding, contractions, leaking of fluid and fetal movement were reviewed in detail with the patient. Please refer to After Visit Summary for other counseling recommendations.  No Follow-up on file.   Lavonia Drafts, MD

## 2016-11-11 ENCOUNTER — Ambulatory Visit (INDEPENDENT_AMBULATORY_CARE_PROVIDER_SITE_OTHER): Payer: PRIVATE HEALTH INSURANCE | Admitting: Obstetrics & Gynecology

## 2016-11-11 VITALS — BP 130/78 | HR 92 | Wt 247.0 lb

## 2016-11-11 DIAGNOSIS — O10913 Unspecified pre-existing hypertension complicating pregnancy, third trimester: Secondary | ICD-10-CM

## 2016-11-11 DIAGNOSIS — O10919 Unspecified pre-existing hypertension complicating pregnancy, unspecified trimester: Secondary | ICD-10-CM

## 2016-11-11 DIAGNOSIS — O0990 Supervision of high risk pregnancy, unspecified, unspecified trimester: Secondary | ICD-10-CM

## 2016-11-11 DIAGNOSIS — O365990 Maternal care for other known or suspected poor fetal growth, unspecified trimester, not applicable or unspecified: Secondary | ICD-10-CM | POA: Insufficient documentation

## 2016-11-11 DIAGNOSIS — O365930 Maternal care for other known or suspected poor fetal growth, third trimester, not applicable or unspecified: Secondary | ICD-10-CM

## 2016-11-11 NOTE — Progress Notes (Signed)
   PRENATAL VISIT NOTE  Subjective:  Misty Hodge is a 40 y.o. G3P0110 at [redacted]w[redacted]d being seen today for ongoing prenatal care.  She is currently monitored for the following issues for this low-risk pregnancy and has VENEREAL WART; HEADACHE, TENSION; WEIGHT GAIN; Obesity (BMI 30-39.9); Essential hypertension; AMA (advanced maternal age) primigravida 34+; Elevated transaminase level; IUFD (intrauterine fetal death); Supervision of high risk pregnancy, antepartum; Chronic hypertension during pregnancy, antepartum; and AC <3% on her problem list.  Patient reports no complaints.  Contractions: Not present. Vag. Bleeding: None.  Movement: Present. Denies leaking of fluid.   The following portions of the patient's history were reviewed and updated as appropriate: allergies, current medications, past family history, past medical history, past social history, past surgical history and problem list. Problem list updated.  Objective:   Vitals:   11/11/16 1605  BP: 130/78  Pulse: 92  Weight: 247 lb (112 kg)    Fetal Status: Fetal Heart Rate (bpm): NST-R   Movement: Present     General:  Alert, oriented and cooperative. Patient is in no acute distress.  Skin: Skin is warm and dry. No rash noted.   Cardiovascular: Normal heart rate noted  Respiratory: Normal respiratory effort, no problems with respiration noted  Abdomen: Soft, gravid, appropriate for gestational age. Pain/Pressure: Present     Pelvic:  Cervical exam deferred        Extremities: Normal range of motion.     Mental Status: Normal mood and affect. Normal behavior. Normal judgment and thought content.   Assessment and Plan:  Pregnancy: G3P0110 at [redacted]w[redacted]d  1. Supervision of high risk pregnancy, antepartum -Needs to pick pediatrician  2. Chronic hypertension during pregnancy, antepartum -BP stable on 400mg  labetaolo bid -serical growth [place] -No signs pre-E today  3. Poor fetal growth affecting management of mother in third  trimester, single or unspecified fetus -Weekly BPP and dopplers with MFM  Preterm labor symptoms and general obstetric precautions including but not limited to vaginal bleeding, contractions, leaking of fluid and fetal movement were reviewed in detail with the patient. Please refer to After Visit Summary for other counseling recommendations.  Return in about 1 week (around 11/18/2016).   Guss Bunde, MD

## 2016-11-13 ENCOUNTER — Encounter (HOSPITAL_COMMUNITY): Payer: Self-pay

## 2016-11-13 ENCOUNTER — Ambulatory Visit (HOSPITAL_COMMUNITY)
Admission: RE | Admit: 2016-11-13 | Discharge: 2016-11-13 | Disposition: A | Discharge status: Home / Self Care | Payer: PRIVATE HEALTH INSURANCE | Source: Ambulatory Visit | Attending: Obstetrics & Gynecology | Admitting: Obstetrics & Gynecology

## 2016-11-13 DIAGNOSIS — O10013 Pre-existing essential hypertension complicating pregnancy, third trimester: Secondary | ICD-10-CM | POA: Diagnosis not present

## 2016-11-13 DIAGNOSIS — O09293 Supervision of pregnancy with other poor reproductive or obstetric history, third trimester: Secondary | ICD-10-CM | POA: Insufficient documentation

## 2016-11-13 DIAGNOSIS — O09523 Supervision of elderly multigravida, third trimester: Secondary | ICD-10-CM | POA: Insufficient documentation

## 2016-11-13 DIAGNOSIS — O365930 Maternal care for other known or suspected poor fetal growth, third trimester, not applicable or unspecified: Secondary | ICD-10-CM | POA: Diagnosis not present

## 2016-11-13 DIAGNOSIS — O99213 Obesity complicating pregnancy, third trimester: Secondary | ICD-10-CM | POA: Insufficient documentation

## 2016-11-13 DIAGNOSIS — Z3A33 33 weeks gestation of pregnancy: Secondary | ICD-10-CM | POA: Insufficient documentation

## 2016-11-18 ENCOUNTER — Inpatient Hospital Stay (HOSPITAL_COMMUNITY)
Admission: AD | Admit: 2016-11-18 | Discharge: 2016-11-19 | DRG: 781 | Disposition: A | Discharge status: Other Acute Inpatient Hospital | Payer: PRIVATE HEALTH INSURANCE | Source: Ambulatory Visit | Attending: Obstetrics & Gynecology | Admitting: Obstetrics & Gynecology

## 2016-11-18 ENCOUNTER — Encounter (HOSPITAL_COMMUNITY): Payer: Self-pay | Admitting: *Deleted

## 2016-11-18 ENCOUNTER — Ambulatory Visit (INDEPENDENT_AMBULATORY_CARE_PROVIDER_SITE_OTHER): Payer: PRIVATE HEALTH INSURANCE | Admitting: Obstetrics & Gynecology

## 2016-11-18 VITALS — BP 156/91 | HR 116 | Wt 249.0 lb

## 2016-11-18 DIAGNOSIS — O113 Pre-existing hypertension with pre-eclampsia, third trimester: Principal | ICD-10-CM | POA: Diagnosis present

## 2016-11-18 DIAGNOSIS — O10919 Unspecified pre-existing hypertension complicating pregnancy, unspecified trimester: Secondary | ICD-10-CM

## 2016-11-18 DIAGNOSIS — Z833 Family history of diabetes mellitus: Secondary | ICD-10-CM

## 2016-11-18 DIAGNOSIS — O09523 Supervision of elderly multigravida, third trimester: Secondary | ICD-10-CM | POA: Diagnosis not present

## 2016-11-18 DIAGNOSIS — Z8249 Family history of ischemic heart disease and other diseases of the circulatory system: Secondary | ICD-10-CM | POA: Diagnosis not present

## 2016-11-18 DIAGNOSIS — O10013 Pre-existing essential hypertension complicating pregnancy, third trimester: Secondary | ICD-10-CM | POA: Diagnosis present

## 2016-11-18 DIAGNOSIS — O98313 Other infections with a predominantly sexual mode of transmission complicating pregnancy, third trimester: Secondary | ICD-10-CM

## 2016-11-18 DIAGNOSIS — Z3A34 34 weeks gestation of pregnancy: Secondary | ICD-10-CM | POA: Diagnosis not present

## 2016-11-18 DIAGNOSIS — A6000 Herpesviral infection of urogenital system, unspecified: Secondary | ICD-10-CM | POA: Diagnosis present

## 2016-11-18 DIAGNOSIS — O0990 Supervision of high risk pregnancy, unspecified, unspecified trimester: Secondary | ICD-10-CM

## 2016-11-18 DIAGNOSIS — O09513 Supervision of elderly primigravida, third trimester: Secondary | ICD-10-CM

## 2016-11-18 DIAGNOSIS — O119 Pre-existing hypertension with pre-eclampsia, unspecified trimester: Secondary | ICD-10-CM | POA: Diagnosis not present

## 2016-11-18 DIAGNOSIS — Z823 Family history of stroke: Secondary | ICD-10-CM | POA: Diagnosis not present

## 2016-11-18 DIAGNOSIS — A6009 Herpesviral infection of other urogenital tract: Secondary | ICD-10-CM

## 2016-11-18 LAB — COMPREHENSIVE METABOLIC PANEL
ALK PHOS: 89 U/L (ref 38–126)
ALT: 25 U/L (ref 14–54)
AST: 33 U/L (ref 15–41)
Albumin: 3.1 g/dL — ABNORMAL LOW (ref 3.5–5.0)
Anion gap: 11 (ref 5–15)
BILIRUBIN TOTAL: 0.3 mg/dL (ref 0.3–1.2)
BUN: 16 mg/dL (ref 6–20)
CALCIUM: 9.5 mg/dL (ref 8.9–10.3)
CHLORIDE: 106 mmol/L (ref 101–111)
CO2: 18 mmol/L — ABNORMAL LOW (ref 22–32)
CREATININE: 0.93 mg/dL (ref 0.44–1.00)
Glucose, Bld: 164 mg/dL — ABNORMAL HIGH (ref 65–99)
Potassium: 4.2 mmol/L (ref 3.5–5.1)
Sodium: 135 mmol/L (ref 135–145)
TOTAL PROTEIN: 6.8 g/dL (ref 6.5–8.1)

## 2016-11-18 LAB — CBC
HEMATOCRIT: 36.3 % (ref 36.0–46.0)
HEMOGLOBIN: 12.2 g/dL (ref 12.0–15.0)
MCH: 30.4 pg (ref 26.0–34.0)
MCHC: 33.6 g/dL (ref 30.0–36.0)
MCV: 90.5 fL (ref 78.0–100.0)
PLATELETS: 306 10*3/uL (ref 150–400)
RBC: 4.01 MIL/uL (ref 3.87–5.11)
RDW: 14.3 % (ref 11.5–15.5)
WBC: 11.3 10*3/uL — AB (ref 4.0–10.5)

## 2016-11-18 LAB — PROTEIN / CREATININE RATIO, URINE
CREATININE, URINE: 83 mg/dL
Protein Creatinine Ratio: 1.75 mg/mg{Cre} — ABNORMAL HIGH (ref 0.00–0.15)
TOTAL PROTEIN, URINE: 145 mg/dL

## 2016-11-18 LAB — TYPE AND SCREEN
ABO/RH(D): A POS
ANTIBODY SCREEN: NEGATIVE

## 2016-11-18 MED ORDER — BETAMETHASONE SOD PHOS & ACET 6 (3-3) MG/ML IJ SUSP
12.0000 mg | Freq: Once | INTRAMUSCULAR | Status: AC
  Administered 2016-11-18: 12 mg via INTRAMUSCULAR

## 2016-11-18 MED ORDER — PRENATAL MULTIVITAMIN CH
1.0000 | ORAL_TABLET | Freq: Every day | ORAL | Status: DC
  Administered 2016-11-19: 1 via ORAL
  Filled 2016-11-18: qty 1

## 2016-11-18 MED ORDER — BETAMETHASONE SOD PHOS & ACET 6 (3-3) MG/ML IJ SUSP
12.0000 mg | Freq: Once | INTRAMUSCULAR | Status: AC
  Administered 2016-11-19: 12 mg via INTRAMUSCULAR
  Filled 2016-11-18: qty 2

## 2016-11-18 MED ORDER — DOCUSATE SODIUM 100 MG PO CAPS
100.0000 mg | ORAL_CAPSULE | Freq: Every day | ORAL | Status: DC
  Filled 2016-11-18: qty 1

## 2016-11-18 MED ORDER — ACETAMINOPHEN 325 MG PO TABS: 650.0000 mg | ORAL_TABLET | ORAL | Status: DC | PRN

## 2016-11-18 MED ORDER — ZOLPIDEM TARTRATE 5 MG PO TABS: 5.0000 mg | ORAL_TABLET | Freq: Every evening | ORAL | Status: DC | PRN

## 2016-11-18 MED ORDER — ASPIRIN EC 81 MG PO TBEC
81.0000 mg | DELAYED_RELEASE_TABLET | Freq: Every day | ORAL | Status: DC
  Administered 2016-11-19: 81 mg via ORAL
  Filled 2016-11-18 (×2): qty 1

## 2016-11-18 MED ORDER — LABETALOL HCL 200 MG PO TABS
400.0000 mg | ORAL_TABLET | Freq: Two times a day (BID) | ORAL | Status: DC
  Administered 2016-11-19: 400 mg via ORAL
  Filled 2016-11-18: qty 2

## 2016-11-18 MED ORDER — CALCIUM CARBONATE ANTACID 500 MG PO CHEW: 2.0000 | CHEWABLE_TABLET | ORAL | Status: DC | PRN

## 2016-11-18 NOTE — MAU Note (Signed)
PT  SAYS SHE WENT  TO DR     LEGGETT  OFFICE  TODAY   FOR  NST.  BP  WAS UP    HAD PROTEIN IN UA.    NO VE  TODAY.    NO H/A   TODAY- BUT  DID YESTERDAY -  NO MEDS-   WENT   AWAY.   NO BLURRED   VISION.    NO EPIGASTRIC  PAIN.        FEELS  BABY  MOVE.    WAS TOLD  TO  COME  HERE    FROM OFFICE.

## 2016-11-18 NOTE — MAU Provider Note (Signed)
Chief Complaint:  Hypertension   First Provider Initiated Contact with Patient 11/18/16 2037     HPI: Misty Hodge is a 40 y.o. G3P0110 at 21w4dwho presents to maternity admissions reporting Chronic Hypertension in pregnancy with exacerbation of BP today. She reports good fetal movement, denies LOF, vaginal bleeding, vaginal itching/burning, urinary symptoms, h/a, dizziness, n/v, diarrhea, constipation or fever/chills.  She denies headache, visual changes or RUQ abdominal pain.   Hypertension  This is a chronic problem. The current episode started today. The problem is unchanged. Associated symptoms include peripheral edema. Pertinent negatives include no anxiety, blurred vision, chest pain, headaches, malaise/fatigue or shortness of breath. There are no associated agents to hypertension. Risk factors: chronic hypertension. There are no compliance problems.     Past Medical History: Past Medical History:  Diagnosis Date  . Boil, groin    recurrent of RT  . Fibroadenoma   . Hypertension   . IUFD (intrauterine fetal death)   . Vaginal Pap smear, abnormal     Past obstetric history: OB History  Gravida Para Term Preterm AB Living  3 1   1 1  0  SAB TAB Ectopic Multiple Live Births    1   0      # Outcome Date GA Lbr Len/2nd Weight Sex Delivery Anes PTL Lv  3 Current           2 Preterm 10/17/15 [redacted]w[redacted]d  3.2 oz (0.091 kg) M Vag-Spont None  FD  1 TAB               Past Surgical History: Past Surgical History:  Procedure Laterality Date  . LT breast needle biopsy    . LT breast solid or complex nodule      Family History: Family History  Problem Relation Age of Onset  . Diabetes Maternal Grandfather   . Stroke Maternal Grandfather   . Seizures Mother   . Hypertension Mother   . Hypertension Father   . Cancer Neg Hx     Social History: Social History  Substance Use Topics  . Smoking status: Never Smoker  . Smokeless tobacco: Never Used  . Alcohol use No     Allergies: No Known Allergies  Meds:  Prescriptions Prior to Admission  Medication Sig Dispense Refill Last Dose  . acetaminophen (TYLENOL) 325 MG tablet Take 2 tablets (650 mg total) by mouth every 4 (four) hours as needed (for pain scale < 4ORtemperature>/=100.5 F). 90 tablet 3 Taking  . aspirin EC 81 MG tablet Take 1 tablet (81 mg total) by mouth daily. 30 tablet 9 Taking  . IRON PO Take by mouth.   Taking  . labetalol (NORMODYNE) 200 MG tablet Take 2 tablets (400 mg total) by mouth 2 (two) times daily. 60 tablet 3 Taking  . Prenatal Multivit-Min-Fe-FA (PRENATAL VITAMINS) 0.8 MG tablet Take 1 tablet by mouth daily. 30 tablet 12 Taking    I have reviewed patient's Past Medical Hx, Surgical Hx, Family Hx, Social Hx, medications and allergies.   ROS:  Review of Systems  Constitutional: Negative for malaise/fatigue.  Eyes: Negative for blurred vision.  Respiratory: Negative for shortness of breath.   Cardiovascular: Negative for chest pain.  Neurological: Negative for headaches.   Other systems negative  Physical Exam  Patient Vitals for the past 24 hrs:  BP Temp Temp src Pulse Resp Height Weight  11/18/16 2102 140/88 - - 88 - - -  11/18/16 2047 137/91 - - 96 - - -  11/18/16  2032 144/94 - - 102 - - -  11/18/16 2027 134/92 - - 101 - - -  11/18/16 2014 137/87 97.8 F (36.6 C) Oral 98 20 5\' 6"  (1.676 m) 251 lb 8 oz (114.1 kg)   Constitutional: Well-developed, well-nourished female in no acute distress.  Cardiovascular: normal rate and rhythm Respiratory: normal effort, clear to auscultation bilaterally GI: Abd soft, non-tender, gravid appropriate for gestational age.   No rebound or guarding. MS: Extremities nontender, 1+ edema, normal ROM Neurologic: Alert and oriented x 4.  DTRs 1+ with no clonus GU: Neg CVAT.  PELVIC EXAM: deferred  FHT:  Baseline 135 , moderate variability, accelerations present, no decelerations Contractions: Rare   Labs:  Imaging:    MAU Course/MDM: I have ordered labs. NST reviewed, reactive, Category I   Assessment: 1. Chronic hypertension during pregnancy, antepartum   2. Supervision of high risk pregnancy, antepartum   3. Elderly primigravida in third trimester     Plan: Report given to oncoming CNM Labs pending   Hansel Feinstein CNM, MSN Certified Nurse-Midwife 11/18/2016 9:08 PM

## 2016-11-18 NOTE — H&P (Signed)
Chief Complaint:  Hypertension   First Provider Initiated Contact with Patient 11/18/16 2037     HPI: Misty Hodge is a 40 y.o. G3P0110 at 31w4dwho presents to maternity admissions reporting Chronic Hypertension in pregnancy with exacerbation of BP today. She reports good fetal movement, denies LOF, vaginal bleeding, vaginal itching/burning, urinary symptoms, h/a, dizziness, n/v, diarrhea, constipation or fever/chills.  She denies headache, visual changes or RUQ abdominal pain.   Hypertension  This is a chronic problem. The current episode started today. The problem is unchanged. Associated symptoms include peripheral edema. Pertinent negatives include no anxiety, blurred vision, chest pain, headaches, malaise/fatigue or shortness of breath. There are no associated agents to hypertension. Risk factors: chronic hypertension. There are no compliance problems.     Past Medical History:     Past Medical History:  Diagnosis Date  . Boil, groin    recurrent of RT  . Fibroadenoma   . Hypertension   . IUFD (intrauterine fetal death)   . Vaginal Pap smear, abnormal     Past obstetric history:                 OB History  Gravida Para Term Preterm AB Living  3 1   1 1  0  SAB TAB Ectopic Multiple Live Births    1   0      # Outcome Date GA Lbr Len/2nd Weight Sex Delivery Anes PTL Lv  3 Current           2 Preterm 10/17/15 [redacted]w[redacted]d  3.2 oz (0.091 kg) M Vag-Spont None  FD  1 TAB               Past Surgical History:      Past Surgical History:  Procedure Laterality Date  . LT breast needle biopsy    . LT breast solid or complex nodule      Family History:      Family History  Problem Relation Age of Onset  . Diabetes Maternal Grandfather   . Stroke Maternal Grandfather   . Seizures Mother   . Hypertension Mother   . Hypertension Father   . Cancer Neg Hx     Social History:     Social History  Substance Use Topics   . Smoking status: Never Smoker  . Smokeless tobacco: Never Used  . Alcohol use No    Allergies: No Known Allergies  Meds:         Prescriptions Prior to Admission  Medication Sig Dispense Refill Last Dose  . acetaminophen (TYLENOL) 325 MG tablet Take 2 tablets (650 mg total) by mouth every 4 (four) hours as needed (for pain scale < 4ORtemperature>/=100.5 F). 90 tablet 3 Taking  . aspirin EC 81 MG tablet Take 1 tablet (81 mg total) by mouth daily. 30 tablet 9 Taking  . IRON PO Take by mouth.   Taking  . labetalol (NORMODYNE) 200 MG tablet Take 2 tablets (400 mg total) by mouth 2 (two) times daily. 60 tablet 3 Taking  . Prenatal Multivit-Min-Fe-FA (PRENATAL VITAMINS) 0.8 MG tablet Take 1 tablet by mouth daily. 30 tablet 12 Taking    I have reviewed patient's Past Medical Hx, Surgical Hx, Family Hx, Social Hx, medications and allergies.   ROS:  Review of Systems  Constitutional: Negative for malaise/fatigue.  Eyes: Negative for blurred vision.  Respiratory: Negative for shortness of breath.   Cardiovascular: Negative for chest pain.  Neurological: Negative for headaches.   Other systems negative  Physical Exam  Patient Vitals for the past 24 hrs:  BP Temp Temp src Pulse Resp Height Weight  11/18/16 2102 140/88 - - 88 - - -  11/18/16 2047 137/91 - - 96 - - -  11/18/16 2032 144/94 - - 102 - - -  11/18/16 2027 134/92 - - 101 - - -  11/18/16 2014 137/87 97.8 F (36.6 C) Oral 98 20 5\' 6"  (1.676 m) 251 lb 8 oz (114.1 kg)   Constitutional: Well-developed, well-nourished female in no acute distress.  Cardiovascular: normal rate and rhythm Respiratory: normal effort, clear to auscultation bilaterally GI: Abd soft, non-tender, gravid appropriate for gestational age.   No rebound or guarding. MS: Extremities nontender, 1+ edema, normal ROM Neurologic: Alert and oriented x 4.  DTRs 1+ with no clonus GU: Neg CVAT.  PELVIC EXAM: deferred  FHT:  Baseline 135 ,  moderate variability, accelerations present, no decelerations Contractions: Rare   Labs: CBC    Component Value Date/Time   WBC 11.3 (H) 11/18/2016 2031   RBC 4.01 11/18/2016 2031   HGB 12.2 11/18/2016 2031   HCT 36.3 11/18/2016 2031   PLT 306 11/18/2016 2031   MCV 90.5 11/18/2016 2031   MCH 30.4 11/18/2016 2031   MCHC 33.6 11/18/2016 2031   RDW 14.3 11/18/2016 2031   LYMPHSABS 3.0 07/18/2015 1607   MONOABS 0.3 07/18/2015 1607   EOSABS 0.1 07/18/2015 1607   BASOSABS 0.0 07/18/2015 1607   CMP     Component Value Date/Time   NA 135 11/18/2016 2031   K 4.2 11/18/2016 2031   CL 106 11/18/2016 2031   CO2 18 (L) 11/18/2016 2031   GLUCOSE 164 (H) 11/18/2016 2031   BUN 16 11/18/2016 2031   CREATININE 0.93 11/18/2016 2031   CREATININE 0.82 06/17/2016 1613   CALCIUM 9.5 11/18/2016 2031   PROT 6.8 11/18/2016 2031   ALBUMIN 3.1 (L) 11/18/2016 2031   AST 33 11/18/2016 2031   ALT 25 11/18/2016 2031   ALKPHOS 89 11/18/2016 2031   BILITOT 0.3 11/18/2016 2031   GFRNONAA >60 11/18/2016 2031   GFRNONAA >89 12/02/2014 1147   GFRAA >60 11/18/2016 2031   GFRAA >89 12/02/2014 1147   Urine P/C ratio: 1.75  Imaging:   MAU Course/MDM: I have ordered labs. NST reviewed, reactive, Category I   Assessment: 1. Chronic hypertension during pregnancy, antepartum   2. Supervision of high risk pregnancy, antepartum   3. Elderly primigravida in third trimester     Plan: Report given to oncoming CNM Labs pending   Hansel Feinstein CNM, MSN Certified Nurse-Midwife 11/18/2016 9:08 PM  Pt feels well; denies H/A or visual disturbances today. No RUQ pain. Rec'd BMZ x 1 in the office today. Known cHTN; hx 22wk IUFD; AC <3%  BP: 145/88, 149/87 EFM: 135-145, +accels, no decels No uterine activity  IUP@34 .4wks cHTN w/ superimposed pre-e  Admit to [facility], growth U/S in AM: second BMZ at 1700 11/21 Follow BPs  Jennavie Martinek, [facility] 11/18/2016 9:58 PM

## 2016-11-18 NOTE — Progress Notes (Signed)
   PRENATAL VISIT NOTE  Subjective:  Misty Hodge is a 40 y.o. G3P0110 at [redacted]w[redacted]d being seen today for ongoing prenatal care.  She is currently monitored for the following issues for this high-risk pregnancy and has VENEREAL WART; HEADACHE, TENSION; WEIGHT GAIN; Obesity (BMI 30-39.9); Essential hypertension; AMA (advanced maternal age) primigravida 74+; Elevated transaminase level; IUFD (intrauterine fetal death); Supervision of high risk pregnancy, antepartum; Chronic hypertension during pregnancy, antepartum; and AC <3% on her problem list.  Patient reports swelling, nad headache 2 days ago.   .  .   . Denies leaking of fluid.   The following portions of the patient's history were reviewed and updated as appropriate: allergies, current medications, past family history, past medical history, past social history, past surgical history and problem list. Problem list updated.  Objective:  There were no vitals filed for this visit.  Fetal Status:           General:  Alert, oriented and cooperative. Patient is in no acute distress.  Skin: Skin is warm and dry. No rash noted.   Cardiovascular: Normal heart rate noted  Respiratory: Normal respiratory effort, no problems with respiration noted  Abdomen: Soft, gravid, appropriate for gestational age.       Pelvic:  Cervical exam deferred        Extremities: Normal range of motion.     Mental Status: Normal mood and affect. Normal behavior. Normal judgment and thought content.   Assessment and Plan:  Pregnancy: G3P0110 at [redacted]w[redacted]d  New onset proteinuria and increased BP worrisome for pre E Betamethason in office Go to MAU for evaluation and probable Pre E Pt having HSV outbreak--feels tingling.  Pt needs to be placed on treatment level of Valtrex.  1.  Chronic Hypertension with superimposed pre E -Pt to go to [facility] for serial BP, labs  2.HSV -500 mg valtrex bid  3.Needs GBS  Preterm labor symptoms and general obstetric precautions  including but not limited to vaginal bleeding, contractions, leaking of fluid and fetal movement were reviewed in detail with the patient. Please refer to After Visit Summary for other counseling recommendations.    Guss Bunde, MD

## 2016-11-18 NOTE — Progress Notes (Signed)
BP rechecked @ 4:45 pm  147/87  Pt sent to MAU per Dr Gala Romney.

## 2016-11-19 ENCOUNTER — Inpatient Hospital Stay (HOSPITAL_COMMUNITY): Payer: PRIVATE HEALTH INSURANCE

## 2016-11-19 DIAGNOSIS — O119 Pre-existing hypertension with pre-eclampsia, unspecified trimester: Secondary | ICD-10-CM

## 2016-11-19 DIAGNOSIS — A6000 Herpesviral infection of urogenital system, unspecified: Secondary | ICD-10-CM

## 2016-11-19 LAB — COMPREHENSIVE METABOLIC PANEL
ALT: 23 U/L (ref 14–54)
ANION GAP: 8 (ref 5–15)
AST: 28 U/L (ref 15–41)
Albumin: 2.9 g/dL — ABNORMAL LOW (ref 3.5–5.0)
Alkaline Phosphatase: 84 U/L (ref 38–126)
BILIRUBIN TOTAL: 0.4 mg/dL (ref 0.3–1.2)
BUN: 17 mg/dL (ref 6–20)
CHLORIDE: 109 mmol/L (ref 101–111)
CO2: 19 mmol/L — ABNORMAL LOW (ref 22–32)
Calcium: 9.2 mg/dL (ref 8.9–10.3)
Creatinine, Ser: 0.8 mg/dL (ref 0.44–1.00)
Glucose, Bld: 142 mg/dL — ABNORMAL HIGH (ref 65–99)
POTASSIUM: 4.4 mmol/L (ref 3.5–5.1)
Sodium: 136 mmol/L (ref 135–145)
TOTAL PROTEIN: 6.5 g/dL (ref 6.5–8.1)

## 2016-11-19 MED ORDER — VALACYCLOVIR HCL 500 MG PO TABS
500.0000 mg | ORAL_TABLET | Freq: Two times a day (BID) | ORAL | Status: DC
Start: 2016-11-19 — End: 2016-11-19
  Filled 2016-11-19 (×2): qty 1

## 2016-11-19 MED ORDER — VALACYCLOVIR HCL 500 MG PO TABS: 500.0000 mg | ORAL_TABLET | Freq: Two times a day (BID) | ORAL | Status: AC

## 2016-11-19 NOTE — Discharge Summary (Signed)
[facility] Transfer Summary  Patient ID: Misty Hodge MRN: [id number] DOB/AGE: 01/10/1976 40 y.o.  Admit date: 11/18/2016 Discharge date: 11/19/2016  Discharge Diagnoses: Chronic hypertension Superimposed preeclampsia Recurrent HSV genitalis Intrauterine growth restruction   Prenatal Procedures: NST and ultrasound   Significant Diagnostic Studies:       Results for orders placed or performed during the hospital encounter of 11/18/16 (from the past 168 hour(s))  Protein / creatinine ratio, urine   Collection Time: 11/18/16  8:17 PM  Result Value Ref Range   Creatinine, Urine 83.00 mg/dL   Total Protein, Urine 145 mg/dL   Protein Creatinine Ratio 1.75 (H) 0.00 - 0.15 mg/mg[Cre]  CBC   Collection Time: 11/18/16  8:31 PM  Result Value Ref Range   WBC 11.3 (H) 4.0 - 10.5 K/uL   RBC 4.01 3.87 - 5.11 MIL/uL   Hemoglobin 12.2 12.0 - 15.0 g/dL   HCT 36.3 36.0 - 46.0 %   MCV 90.5 78.0 - 100.0 fL   MCH 30.4 26.0 - 34.0 pg   MCHC 33.6 30.0 - 36.0 g/dL   RDW 14.3 11.5 - 15.5 %   Platelets 306 150 - 400 K/uL  Comprehensive metabolic panel   Collection Time: 11/18/16  8:31 PM  Result Value Ref Range   Sodium 135 135 - 145 mmol/L   Potassium 4.2 3.5 - 5.1 mmol/L   Chloride 106 101 - 111 mmol/L   CO2 18 (L) 22 - 32 mmol/L   Glucose, Bld 164 (H) 65 - 99 mg/dL   BUN 16 6 - 20 mg/dL   Creatinine, Ser 0.93 0.44 - 1.00 mg/dL   Calcium 9.5 8.9 - 10.3 mg/dL   Total Protein 6.8 6.5 - 8.1 g/dL   Albumin 3.1 (L) 3.5 - 5.0 g/dL   AST 33 15 - 41 U/L   ALT 25 14 - 54 U/L   Alkaline Phosphatase 89 38 - 126 U/L   Total Bilirubin 0.3 0.3 - 1.2 mg/dL   GFR calc non Af Amer >60 >60 mL/min   GFR calc Af Amer >60 >60 mL/min   Anion gap 11 5 - 15  Type and screen [place]   Collection Time: 11/18/16 10:11 PM  Result Value Ref Range   ABO/RH(D) A POS    Antibody Screen NEG    Sample Expiration 11/21/2016    Comprehensive metabolic panel   Collection Time: 11/19/16  5:16 AM  Result Value Ref Range   Sodium 136 135 - 145 mmol/L   Potassium 4.4 3.5 - 5.1 mmol/L   Chloride 109 101 - 111 mmol/L   CO2 19 (L) 22 - 32 mmol/L   Glucose, Bld 142 (H) 65 - 99 mg/dL   BUN 17 6 - 20 mg/dL   Creatinine, Ser 0.80 0.44 - 1.00 mg/dL   Calcium 9.2 8.9 - 10.3 mg/dL   Total Protein 6.5 6.5 - 8.1 g/dL   Albumin 2.9 (L) 3.5 - 5.0 g/dL   AST 28 15 - 41 U/L   ALT 23 14 - 54 U/L   Alkaline Phosphatase 84 38 - 126 U/L   Total Bilirubin 0.4 0.3 - 1.2 mg/dL   GFR calc non Af Amer >60 >60 mL/min   GFR calc Af Amer >60 >60 mL/min   Anion gap 8 5 - 15     Hospital Course:  This is a 40 y.o. G3P0110 with IUP at [redacted]w[redacted]d admitted for worsening hypertension, found to have new proteinuria, and with recent u/s showing  lagging abdominal growth. The patient was admitted and given one dose BMZ at 17:00 on 11/20 and was dosed again at 17:00 on 11/21. She was monitored with NSTs twice daily. She received f/u growth ultrasound on 11/21 that showed severe growth restriction with normal umbilical artery dopplers. She was continued on labetalol 400 bid. GBS culture was obtained and is pending. She did not complain of symptoms of severe disease, and she did not show laboratory criteria for severe disease. Given severe growth restriction, MFM recommending IOL 24 hours after 2nd dose of BMZ. Our NICU is full, so decision made to transfer. Of note, on the evening of transfer, the patient notified her treatment team that she has a history of HSV genitalis. She says she experienced symptoms of recurrence (burning sensation in genitals) in genitals approximately 3 days ago and began taking acyclovir that had been previously prescribed. We discussed that this will likely be an indication for cesarean section.   Discharge Exam: See progress note dated 11/19/16  Discharge Condition: stable  Disposition: 01-Home or Self  Care     Signed: Desma Maxim M.D. 11/19/2016, 6:12 PM     Electronically signed by Gwynne Edinger, MD at 11/19/2016 6:13 PM Electronically signed by Gwynne Edinger, MD at 11/19/2016 6:13 PM

## 2016-11-19 NOTE — Progress Notes (Signed)
ANTEPARTUM PROGRESS NOTE  Misty Hodge is a 40 y.o. G3P0110 at [redacted]w[redacted]d  who is admitted for superimposed preeclampsia.   Fetal presentation is cephalic. Length of Stay:  1  Days  Subjective: Patient reports good fetal movement.  She reports no uterine contractions, no bleeding and no loss of fluid per vagina. No ha, vision change, upper abd pain, or new sob  Vitals:  Blood pressure (!) 143/86, pulse 92, temperature 97.4 F (36.3 C), temperature source Oral, resp. rate 17, height 5\' 6"  (1.676 m), weight 251 lb (113.9 kg), last menstrual period 03/21/2016, SpO2 98 %. Physical Examination:  General appearance - alert, well appearing, and in no distress Pelvic Exam:  normal external genitalia, vulva, vagina, cervix, uterus and adnexa, examination not indicated Cervical Exam: Not evaluated.  Extremities: extremities normal, atraumatic, no cyanosis or edema  Membranes:intact   Labs:  Results for orders placed or performed during the hospital encounter of 11/18/16 (from the past 24 hour(s))  Protein / creatinine ratio, urine   Collection Time: 11/18/16  8:17 PM  Result Value Ref Range   Creatinine, Urine 83.00 mg/dL   Total Protein, Urine 145 mg/dL   Protein Creatinine Ratio 1.75 (H) 0.00 - 0.15 mg/mg[Cre]  CBC   Collection Time: 11/18/16  8:31 PM  Result Value Ref Range   WBC 11.3 (H) 4.0 - 10.5 K/uL   RBC 4.01 3.87 - 5.11 MIL/uL   Hemoglobin 12.2 12.0 - 15.0 g/dL   HCT 36.3 36.0 - 46.0 %   MCV 90.5 78.0 - 100.0 fL   MCH 30.4 26.0 - 34.0 pg   MCHC 33.6 30.0 - 36.0 g/dL   RDW 14.3 11.5 - 15.5 %   Platelets 306 150 - 400 K/uL  Comprehensive metabolic panel   Collection Time: 11/18/16  8:31 PM  Result Value Ref Range   Sodium 135 135 - 145 mmol/L   Potassium 4.2 3.5 - 5.1 mmol/L   Chloride 106 101 - 111 mmol/L   CO2 18 (L) 22 - 32 mmol/L   Glucose, Bld 164 (H) 65 - 99 mg/dL   BUN 16 6 - 20 mg/dL   Creatinine, Ser 0.93 0.44 - 1.00 mg/dL   Calcium 9.5 8.9 - 10.3 mg/dL   Total Protein 6.8 6.5 - 8.1 g/dL   Albumin 3.1 (L) 3.5 - 5.0 g/dL   AST 33 15 - 41 U/L   ALT 25 14 - 54 U/L   Alkaline Phosphatase 89 38 - 126 U/L   Total Bilirubin 0.3 0.3 - 1.2 mg/dL   GFR calc non Af Amer >60 >60 mL/min   GFR calc Af Amer >60 >60 mL/min   Anion gap 11 5 - 15  Type and screen [place]   Collection Time: 11/18/16 10:11 PM  Result Value Ref Range   ABO/RH(D) A POS    Antibody Screen NEG    Sample Expiration 11/21/2016   Comprehensive metabolic panel   Collection Time: 11/19/16  5:16 AM  Result Value Ref Range   Sodium 136 135 - 145 mmol/L   Potassium 4.4 3.5 - 5.1 mmol/L   Chloride 109 101 - 111 mmol/L   CO2 19 (L) 22 - 32 mmol/L   Glucose, Bld 142 (H) 65 - 99 mg/dL   BUN 17 6 - 20 mg/dL   Creatinine, Ser 0.80 0.44 - 1.00 mg/dL   Calcium 9.2 8.9 - 10.3 mg/dL   Total Protein 6.5 6.5 - 8.1 g/dL   Albumin 2.9 (L) 3.5 -  5.0 g/dL   AST 28 15 - 41 U/L   ALT 23 14 - 54 U/L   Alkaline Phosphatase 84 38 - 126 U/L   Total Bilirubin 0.4 0.3 - 1.2 mg/dL   GFR calc non Af Amer >60 >60 mL/min   GFR calc Af Amer >60 >60 mL/min   Anion gap 8 5 - 15    Imaging Studies:    140/mod/+a/-d   Medications:  Scheduled . aspirin EC  81 mg Oral Daily  . betamethasone acetate-betamethasone sodium phosphate  12 mg Intramuscular Once  . docusate sodium  100 mg Oral Daily  . labetalol  400 mg Oral BID  . prenatal multivitamin  1 tablet Oral Q1200   I have reviewed the patient's current medications.  ASSESSMENT: Patient Active Problem List   Diagnosis Date Noted  . Chronic hypertension with superimposed preeclampsia 11/18/2016  . AC <3% 11/11/2016  . Chronic hypertension during pregnancy, antepartum 05/19/2016  . Supervision of high risk pregnancy, antepartum 05/17/2016  . IUFD (intrauterine fetal death) 2015-10-19  . Elevated transaminase level 09/15/2015  . AMA (advanced maternal age) primigravida 35+ 08/17/2015  . Essential hypertension 10/31/2014   . Obesity (BMI 30-39.9) 10/05/2014  . WEIGHT GAIN 09/28/2009  . HEADACHE, TENSION 08/29/2008  . VENEREAL WART 04/21/2007    PLAN: Second dose bmz today U/s tomorrow for growth, bpp, and UA dopplers Continue labetalol 400 bid and ASA No s/s severe disease GBS culture ordered Possible d/c tomorrow if u/s reassuring  Continue routine antenatal care.   Olen Cordial B Latria Mccarron 11/19/2016,10:02 AM

## 2016-11-19 NOTE — Progress Notes (Addendum)
[facility] Transfer Summary  Patient ID: Misty Hodge MRN: [id number] DOB/AGE: 1976-08-28 40 y.o.  Admit date: 11/18/2016 Discharge date: 11/19/2016  Discharge Diagnoses: Chronic hypertension Superimposed preeclampsia Recurrent HSV genitalis Intrauterine growth restruction   Prenatal Procedures: NST and ultrasound   Significant Diagnostic Studies:  Results for orders placed or performed during the hospital encounter of 11/18/16 (from the past 168 hour(s))  Protein / creatinine ratio, urine   Collection Time: 11/18/16  8:17 PM  Result Value Ref Range   Creatinine, Urine 83.00 mg/dL   Total Protein, Urine 145 mg/dL   Protein Creatinine Ratio 1.75 (H) 0.00 - 0.15 mg/mg[Cre]  CBC   Collection Time: 11/18/16  8:31 PM  Result Value Ref Range   WBC 11.3 (H) 4.0 - 10.5 K/uL   RBC 4.01 3.87 - 5.11 MIL/uL   Hemoglobin 12.2 12.0 - 15.0 g/dL   HCT 36.3 36.0 - 46.0 %   MCV 90.5 78.0 - 100.0 fL   MCH 30.4 26.0 - 34.0 pg   MCHC 33.6 30.0 - 36.0 g/dL   RDW 14.3 11.5 - 15.5 %   Platelets 306 150 - 400 K/uL  Comprehensive metabolic panel   Collection Time: 11/18/16  8:31 PM  Result Value Ref Range   Sodium 135 135 - 145 mmol/L   Potassium 4.2 3.5 - 5.1 mmol/L   Chloride 106 101 - 111 mmol/L   CO2 18 (L) 22 - 32 mmol/L   Glucose, Bld 164 (H) 65 - 99 mg/dL   BUN 16 6 - 20 mg/dL   Creatinine, Ser 0.93 0.44 - 1.00 mg/dL   Calcium 9.5 8.9 - 10.3 mg/dL   Total Protein 6.8 6.5 - 8.1 g/dL   Albumin 3.1 (L) 3.5 - 5.0 g/dL   AST 33 15 - 41 U/L   ALT 25 14 - 54 U/L   Alkaline Phosphatase 89 38 - 126 U/L   Total Bilirubin 0.3 0.3 - 1.2 mg/dL   GFR calc non Af Amer >60 >60 mL/min   GFR calc Af Amer >60 >60 mL/min   Anion gap 11 5 - 15  Type and screen [place]   Collection Time: 11/18/16 10:11 PM  Result Value Ref Range   ABO/RH(D) A POS    Antibody Screen NEG    Sample Expiration 11/21/2016   Comprehensive metabolic panel   Collection Time:  11/19/16  5:16 AM  Result Value Ref Range   Sodium 136 135 - 145 mmol/L   Potassium 4.4 3.5 - 5.1 mmol/L   Chloride 109 101 - 111 mmol/L   CO2 19 (L) 22 - 32 mmol/L   Glucose, Bld 142 (H) 65 - 99 mg/dL   BUN 17 6 - 20 mg/dL   Creatinine, Ser 0.80 0.44 - 1.00 mg/dL   Calcium 9.2 8.9 - 10.3 mg/dL   Total Protein 6.5 6.5 - 8.1 g/dL   Albumin 2.9 (L) 3.5 - 5.0 g/dL   AST 28 15 - 41 U/L   ALT 23 14 - 54 U/L   Alkaline Phosphatase 84 38 - 126 U/L   Total Bilirubin 0.4 0.3 - 1.2 mg/dL   GFR calc non Af Amer >60 >60 mL/min   GFR calc Af Amer >60 >60 mL/min   Anion gap 8 5 - 15     Hospital Course:  This is a 40 y.o. G3P0110 with IUP at [redacted]w[redacted]d admitted for worsening hypertension, found to have new proteinuria, and with recent u/s showing lagging abdominal growth. The patient  was admitted and given one dose BMZ at 17:00 on 11/20 and was dosed again at 17:00 on 11/21. She was monitored with NSTs twice daily. She received f/u growth ultrasound on 11/21 that showed severe growth restriction with normal umbilical artery dopplers. She was continued on labetalol 400 bid. GBS culture was obtained and is pending. She did not complain of symptoms of severe disease, and she did not show laboratory criteria for severe disease. Given severe growth restriction, MFM recommending IOL 24 hours after 2nd dose of BMZ. Our NICU is full, so decision made to transfer. Of note, on the evening of transfer, the patient notified her treatment team that she has a history of HSV genitalis. She says she experienced symptoms of recurrence (burning sensation in genitals) in genitals approximately 3 days ago and began taking acyclovir that had been previously prescribed. We discussed that this will likely be an indication for cesarean section.   Discharge Exam: See progress note dated 11/19/16  Discharge Condition: stable  Disposition: 01-Home or Self Care     Signed: Desma Maxim M.D. 11/19/2016, 6:12 PM

## 2016-11-19 NOTE — Progress Notes (Signed)
Faculty [facility] OB/GYN Attending Note  Received a call from Dr. Griffin Dakin (MFM) who reported that Misty Hodge, 40 y.o. [id number] at [redacted]w[redacted]d admitted for [facility] with superimposed preeclampsia and fetal growth restriction (AC <3%) now has severe IUGR.        She had normal dopplers, BPP 8/8.  But given new diagnosis of severe IUGR (BPD, HC, AC all <3%) , he recommended delivery 24 hours after last betamethasone dose.  Our NICU is full and I was not able to scheduled IOL for tomorrow, there is no anticipated space for this infant.  I called [facility], and patient will be transferred to their [facility] service (Dr. Silvestre Mesi was accepting physician) for further evaluation and for delivery.  Patient was informed of the necessity of this transfer and agrees with plan. House Coverage notified and will help arrange for transfer.  [name], MD, [place] Attending [place], [facility]

## 2016-11-20 ENCOUNTER — Encounter (HOSPITAL_COMMUNITY): Payer: Self-pay

## 2016-11-20 ENCOUNTER — Ambulatory Visit (HOSPITAL_COMMUNITY)
Admission: RE | Admit: 2016-11-20 | Discharge: 2016-11-20 | Disposition: A | Discharge status: Home / Self Care | Payer: PRIVATE HEALTH INSURANCE | Source: Ambulatory Visit | Attending: Obstetrics & Gynecology | Admitting: Obstetrics & Gynecology

## 2016-11-20 LAB — CULTURE, BETA STREP (GROUP B ONLY)

## 2016-12-02 ENCOUNTER — Encounter: Payer: Self-pay | Admitting: Obstetrics & Gynecology

## 2016-12-02 ENCOUNTER — Ambulatory Visit (INDEPENDENT_AMBULATORY_CARE_PROVIDER_SITE_OTHER): Payer: PRIVATE HEALTH INSURANCE | Admitting: Obstetrics & Gynecology

## 2016-12-02 VITALS — BP 138/96 | HR 88 | Resp 16 | Ht 69.0 in | Wt 239.0 lb

## 2016-12-02 DIAGNOSIS — J0100 Acute maxillary sinusitis, unspecified: Secondary | ICD-10-CM | POA: Diagnosis not present

## 2016-12-02 MED ORDER — AMOXICILLIN-POT CLAVULANATE 875-125 MG PO TABS: 1.0000 | ORAL_TABLET | Freq: Two times a day (BID) | ORAL | 1 refills | Status: DC

## 2016-12-02 NOTE — Progress Notes (Signed)
   Subjective:    Patient ID: Misty Hodge, female    DOB: 06/22/1976, 40 y.o.   MRN: [id number]  HPI  40 yo MAA P1 now 1 week pp s/p PLTCS for failed IOL for severe pre eclampsia at [redacted] weeks EGA. Baby girl should be coming home from NICU today. Mom is here because of a sinus infection as well as a BP check. She is on 400 mg labetolol BID. She is feeling overwhelmed, especially having had no sleep since delivery. She denies HI/SI. Husband is here today. We discussed the importance of getting support for mom post partum when he returns to work in 4 days.  Review of Systems     Objective:   Physical Exam WNWHBF Crying sometimes, looks like she feels bad Her incision is healing well       Assessment & Plan:  Sinus infection- augmentin ?baby blues versus depression - at this time she declines medication Re evaluate in 1 week HTN- continue labetolol. Recheck in 1 week

## 2016-12-09 ENCOUNTER — Encounter: Payer: Self-pay | Admitting: Obstetrics & Gynecology

## 2016-12-09 ENCOUNTER — Ambulatory Visit: Payer: PRIVATE HEALTH INSURANCE | Admitting: Obstetrics & Gynecology

## 2016-12-09 VITALS — BP 148/100 | HR 98 | Ht 67.0 in | Wt 227.0 lb

## 2016-12-09 DIAGNOSIS — I10 Essential (primary) hypertension: Secondary | ICD-10-CM

## 2016-12-09 MED ORDER — METOCLOPRAMIDE HCL 10 MG PO TABS: 10.0000 mg | ORAL_TABLET | Freq: Three times a day (TID) | ORAL | 3 refills | Status: DC | PRN

## 2016-12-09 NOTE — Progress Notes (Signed)
Patient scored 4 on Postpartum [place] scale. Kathrene Alu RNBSN

## 2016-12-09 NOTE — Progress Notes (Addendum)
   Subjective:    Patient ID: Misty Hodge, female    DOB: June 27, 1976, 40 y.o.   MRN: [id number]  HPI 40 yo MAA lady here for a BP check. She had her baby 2 weeks ago at [facility] via [facility] for failed IOL for severe preeclampsia. At her visit last week she was feeling overwhelmed but declined meds for depression. She denies SI and HI. She forgot to take her BP medicine today. She reports a great improvement in her mood.  Review of Systems     Objective:   Physical Exam WNWHBFNAD Breathing, conversing, and ambulating normally Abd- benign Depression score 4       Assessment & Plan:  HTN- continue labetolol 400 mg BID Recheck BP in 1 week Add reglan for low milk supply Add feenegruk

## 2016-12-09 NOTE — Addendum Note (Signed)
Addended by: Emily Filbert on: 12/09/2016 04:04 PM   Modules accepted: Orders

## 2016-12-12 ENCOUNTER — Encounter: Payer: Self-pay | Admitting: *Deleted

## 2016-12-16 ENCOUNTER — Ambulatory Visit: Payer: PRIVATE HEALTH INSURANCE | Admitting: Obstetrics & Gynecology

## 2016-12-16 ENCOUNTER — Encounter: Payer: Self-pay | Admitting: Obstetrics & Gynecology

## 2016-12-16 VITALS — BP 135/94 | HR 92 | Wt 223.0 lb

## 2016-12-16 DIAGNOSIS — I10 Essential (primary) hypertension: Secondary | ICD-10-CM

## 2016-12-16 NOTE — Progress Notes (Signed)
   Subjective:    Patient ID: Misty Hodge, female    DOB: Sep 25, 1976, 40 y.o.   MRN: [id number]  HPI 40 yo M AA lady here at 4 weeks Post op s/p PLTCS for BP check.  She is doing well with no problems.   Review of Systems     Objective:   Physical Exam Pleasant WNWHBFNAD Breathing, conversing, and ambulating normally Incision- healing well       Assessment & Plan:  Post op-doing well RTC 2 weeks for PP exam and Mirena insertion

## 2017-01-06 ENCOUNTER — Ambulatory Visit (INDEPENDENT_AMBULATORY_CARE_PROVIDER_SITE_OTHER): Payer: PRIVATE HEALTH INSURANCE | Admitting: Obstetrics & Gynecology

## 2017-01-06 ENCOUNTER — Encounter: Payer: Self-pay | Admitting: Obstetrics & Gynecology

## 2017-01-06 MED ORDER — NORETHINDRONE 0.35 MG PO TABS: 1.0000 | ORAL_TABLET | Freq: Every day | ORAL | 11 refills | Status: DC

## 2017-01-06 NOTE — Progress Notes (Signed)
Post Partum Exam  Misty Hodge is a 41 y.o. AA [id number] female who presents for a postpartum visit. She is 6 weeks postpartum following a C-Section. I have fully reviewed the prenatal and intrapartum course. The delivery was at 35 gestational weeks.  Anesthesia: epidural. Postpartum course has been unremarkable. Baby's course has been unremarkable after NICU. Baby is feeding by breast and bottle. Bleeding: none.  Bowel function is normal. Bladder function is normal.  Patient is not sexually active. Contraception method: husband is getting vasectomy. Postpartum depression screening:neg (score is 0).   The following portions of the patient's history were reviewed and updated as appropriate: allergies, current medications, past family history, past medical history, past social history, past surgical history and problem list.  Review of Systems Pertinent items are noted in HPI.    Objective:    BP 116/78 mmHg  Pulse 78  Resp 16  Ht 5\' 5"  (1.651 m)  Wt 211 lb (95.709 kg)  BMI 35.11 kg/m2  Breastfeeding? Yes  General:  alert   Breasts:  inspection negative, no nipple discharge or bleeding, no masses or nodularity palpable  Lungs: clear to auscultation bilaterally  Heart:  regular rate and rhythm, S1, S2 normal, no murmur, click, rub or gallop  Abdomen: soft, non-tender; bowel sounds normal; no masses,  no organomegaly , Incision: healed great   Vulva:  normal  Vagina: not evaluated  Cervix:  not evaluated  Corpus: not examined  Adnexa:  not evaluated  Rectal Exam: Not performed.        Assessment:    Normal postpartum exam. Pap smear not done at today's visit.   Plan:   1. Contraception: oral progesterone-only contraceptive 2 Strongly rec'd weight loss to help with HTN 3. Follow up in: 3 months for BP check or as needed.

## 2017-04-29 IMAGING — US US OB DETAIL+14 WK
2 series · 14 of 28 positions shown · non-contrast
Comparison: none

[Series 1: us ob detail+14 wk · 93 acquisitions, 13 frames shown (1 of 2)]
[im 4/93]
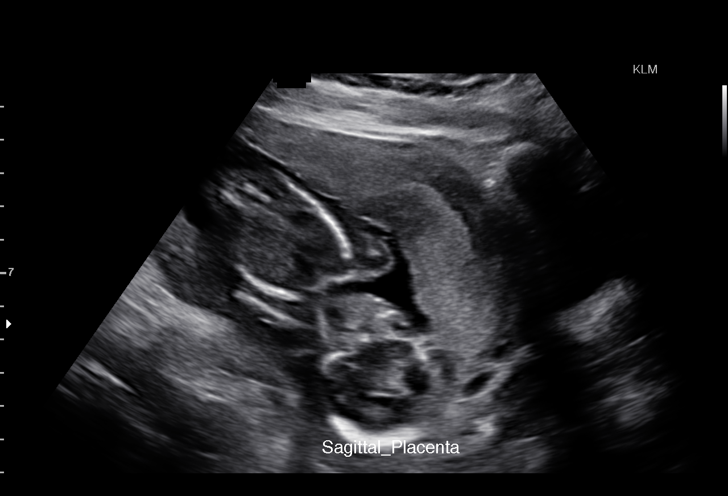
[im 11/93]
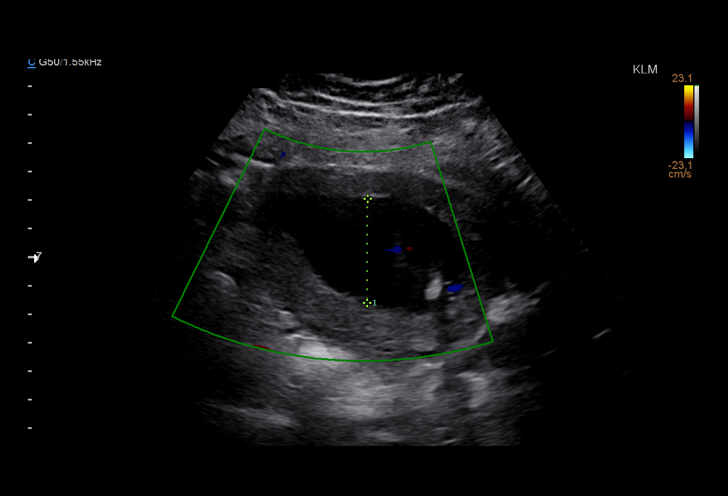
[im 18/93]
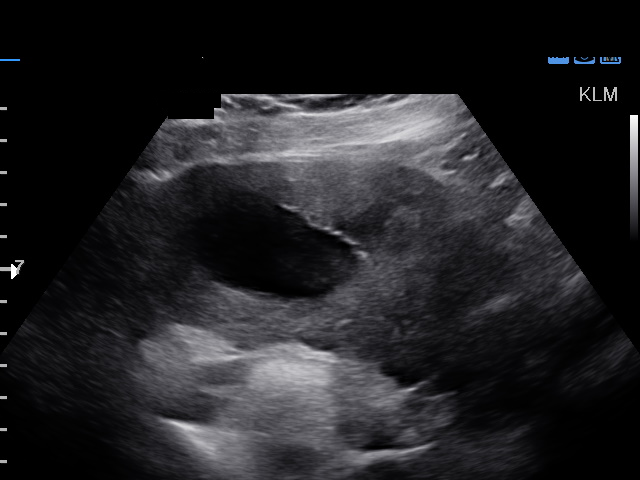
[im 25/93]
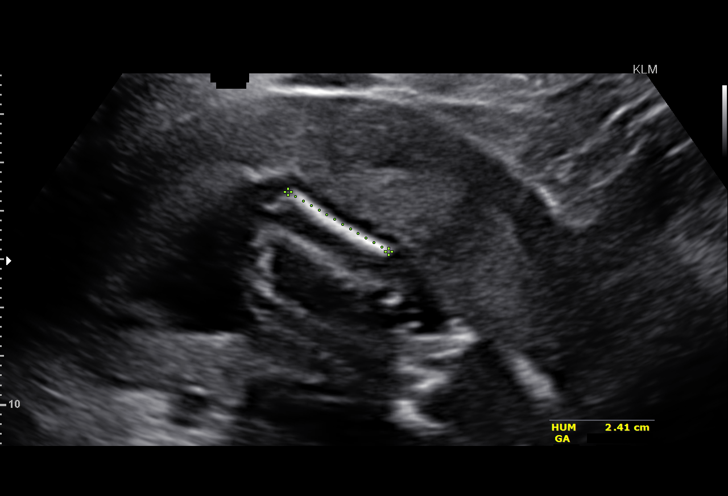
[im 32/93]
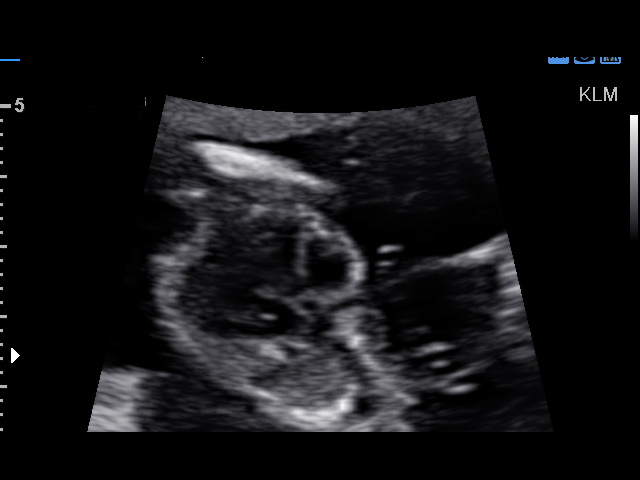
[im 39/93]
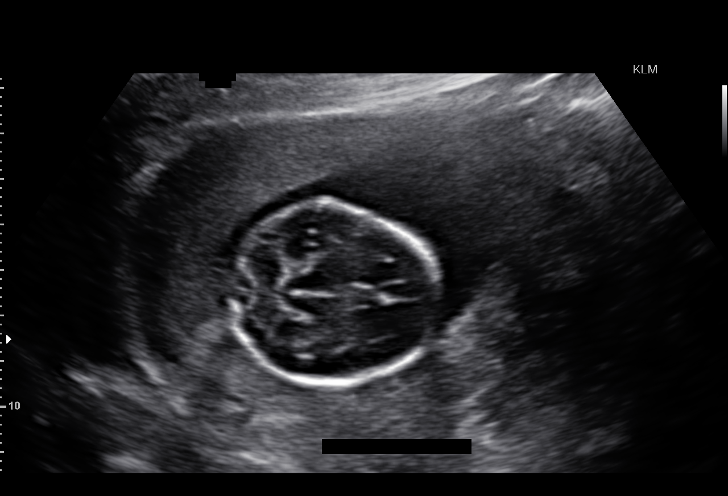
[im 47/93]
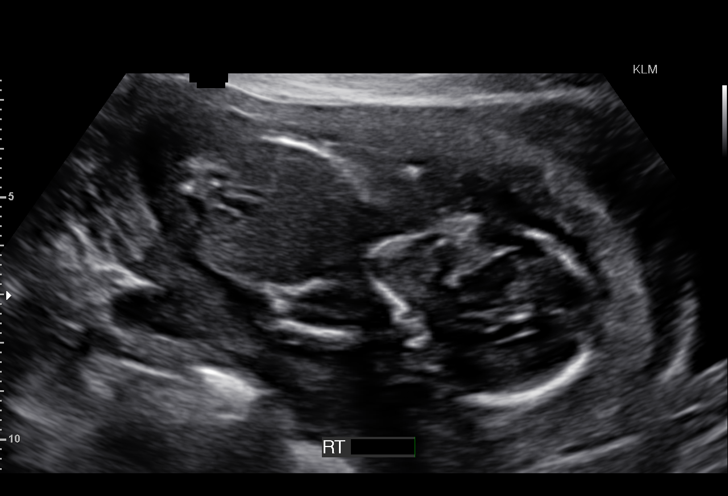
[im 54/93]
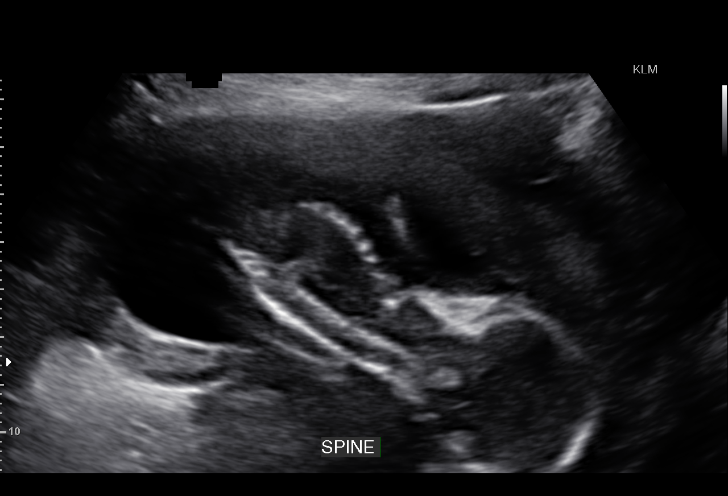
[im 61/93]
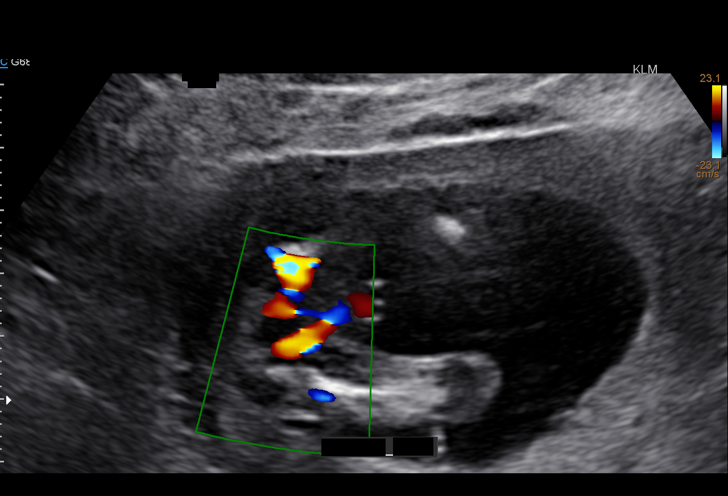
[im 68/93]
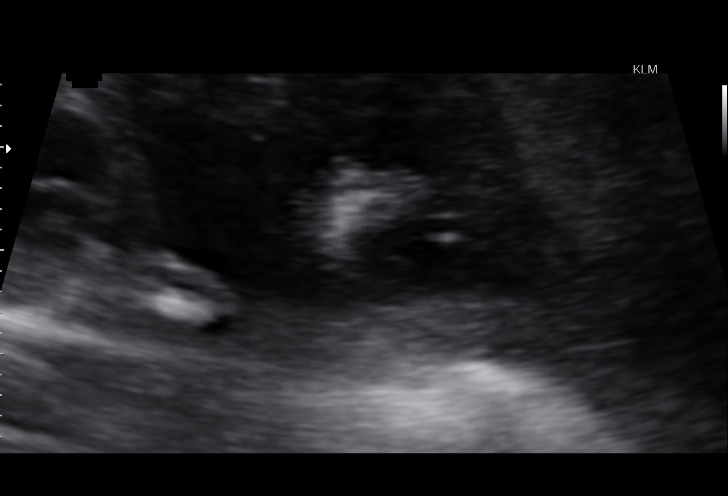
[im 75/93]
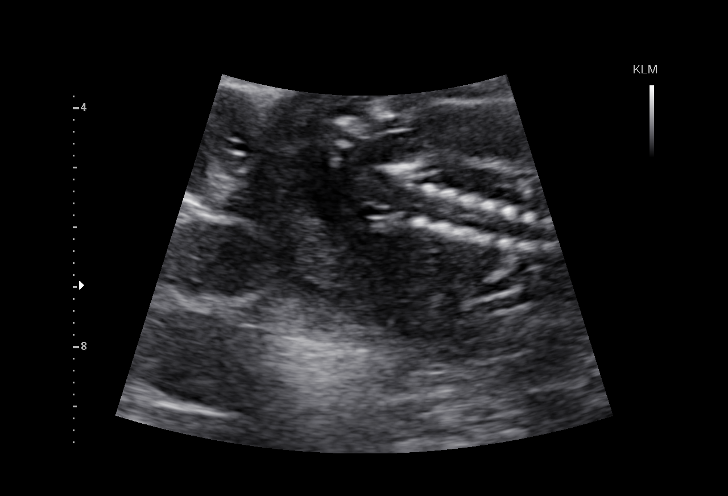
[im 82/93]
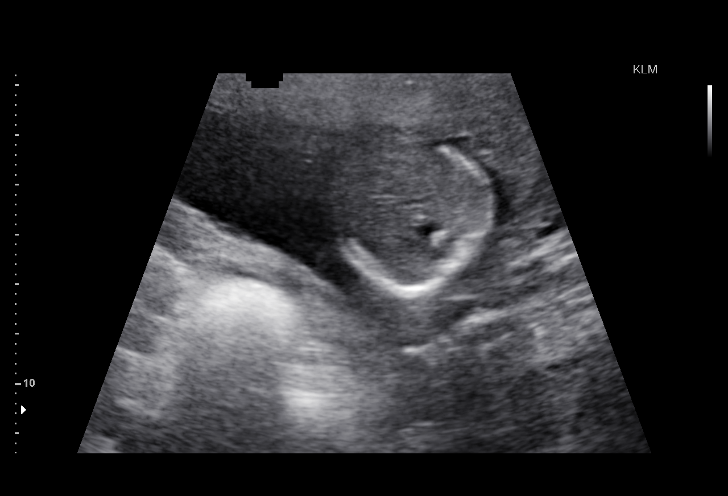
[im 89/93]
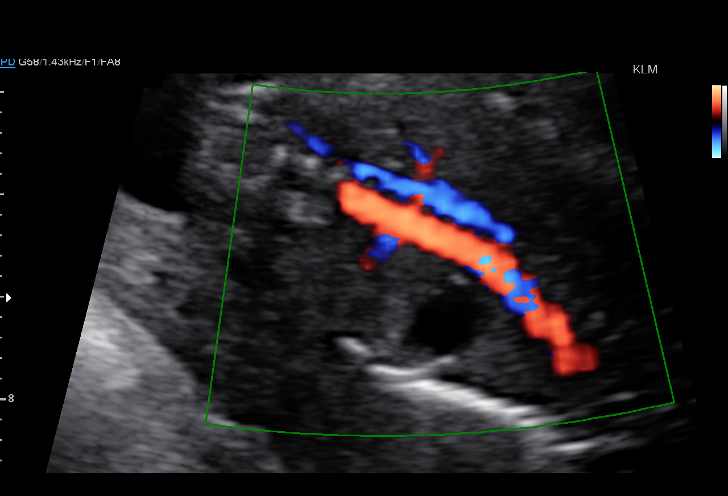

[Series 3: us ob detail+14 wk · 1 of 3 slices shown (2 of 2)]
[im 1/3]
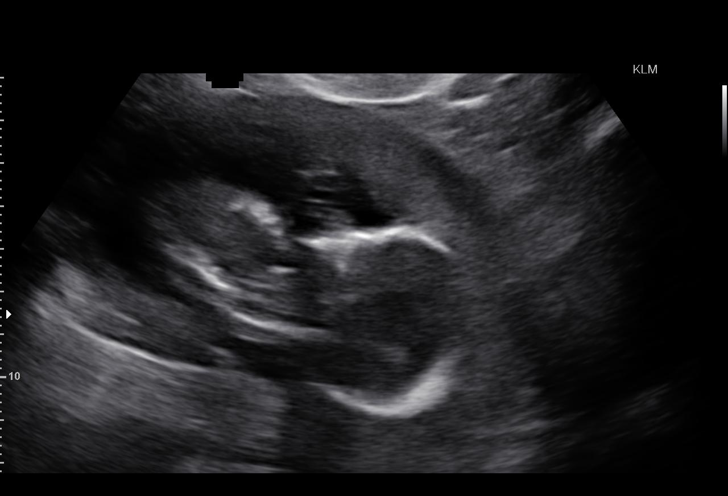

[14 of 28 positions shown; findings below may reference images not displayed]

OBSTETRICS REPORT
(Signed Final 09/21/2015 [DATE])

Name:       FREDY AMILCAR PASSOW                   Visit  09/21/2015 [DATE]
Date:

Service(s) Provided

US OB DETAIL + 14 WK                                   76811.0
Indications

18 weeks gestation of pregnancy
Hypertension - Chronic/Pre-existing on labetalol
and ASA
Detailed fetal anatomic survey                         Z36
Advanced maternal age primigravida 35+, second
trimester; low risk ESMINE
Obesity complicating pregnancy, second
trimester
Fetal Evaluation

Num Of             1
Fetuses:
Fetal Heart        154                          bpm
Rate:
Cardiac Activity:  Observed
Presentation:      Cephalic
Placenta:          Anterior, above cervical
os
P. Cord            Visualized, central
Insertion:

Amniotic Fluid
AFI FV:      Subjectively within normal limits
Larg Pckt:      3.7  cm
Biometry

BPD:     38.9   m    G. Age:   17w 6d                 CI:        78.78   70 - 86
m
FL/HC:      16.7   15.8 -
18
HC:     138.6   m    G. Age:   17w 2d        11  %    HC/AC:      1.21   1.07 -
m
AC:     114.4   m    G. Age:   17w 2d        23  %    FL/BPD
m                                     :
FL:      23.1   m    G. Age:   17w 0d        12  %    FL/AC:      20.2   20 - 24
m
HUM:     24.1   m    G. Age:   17w 3d        40  %
m

Est.         182   gm    0 lb 6 oz      30   %
FW:
Gestational Age

LMP:           18w 0d        Date:  05/18/15                  EDD:   02/22/16
U/S Today:     17w 3d                                         EDD:   02/26/16
Best:          18w 0d    Det. By:   LMP  (05/18/15)           EDD:   02/22/16
Anatomy

Cranium:          Appears normal         Ductal Arch:       Not well visualized
Fetal Cavum:      Appears normal         Diaphragm:         Appears normal
Ventricles:       Appears normal         Stomach:           Appears normal,
left sided
Choroid Plexus:   Appears normal         Abdomen:           Echogenic Bowel
Cerebellum:       Appears normal         Abdominal          Appears nml (cord
Wall:              insert, abd wall)
Posterior         Appears normal         Cord Vessels:      Appears normal (3
Fossa:                                                      vessel cord)
Face:             Not well visualized    Kidneys:           Appear normal
Lips:             Not well visualized    Bladder:           Appears normal
Heart:            Appears normal         Spine:             Appears normal
(4CH, axis, and
situs)
RVOT:             Not well visualized    Lower              Visualized
Extremities:
LVOT:             Appears normal         Upper              Visualized
Extremities:
Aortic Arch:      Not well visualized

Other:   Fetus appears to be a male. Technically difficult due to maternal
habitus and fetal position.
Cervix Uterus Adnexa

Cervical Length:    3.4       cm

Cervix:       Normal appearance by transabdominal scan.
Uterus:       No abnormality visualized.

Left Ovary:    Not visualized.
Right Ovary:   Not visualized.

Adnexa:     No abnormality visualized.
Impression

SIUP at 18+0 weeks
Normal detailed fetal anatomy; limited views of face, RVOT
and arches
Markers of aneuploidy: echogenic bowel
Normal amniotic fluid volume
Measurements consistent with LMP dating

The US findings were shared with Ms.Tonio. The
implications of echogenic bowel were discussed in detail.
All of her prenatal testing and pregnancy management
options were reviewed.  After careful consideration, she
declined amniocentesis, cell free DNA screening and viral
studies (CF neg; Stoney Yeah risk).
Recommendations

Follow-up ultrasound in 4 weeks to complete anatomy
survey

## 2017-06-04 ENCOUNTER — Ambulatory Visit (INDEPENDENT_AMBULATORY_CARE_PROVIDER_SITE_OTHER): Payer: PRIVATE HEALTH INSURANCE | Admitting: Obstetrics & Gynecology

## 2017-06-04 ENCOUNTER — Encounter: Payer: Self-pay | Admitting: Obstetrics & Gynecology

## 2017-06-04 VITALS — BP 129/83 | HR 79 | Wt 219.0 lb

## 2017-06-04 DIAGNOSIS — N898 Other specified noninflammatory disorders of vagina: Secondary | ICD-10-CM | POA: Diagnosis not present

## 2017-06-04 NOTE — Progress Notes (Signed)
   Subjective:    Patient ID: Misty Hodge, female    DOB: 07-06-1976, 41 y.o.   MRN: [id number]  HPI  Pt c/o fishy vaginal odor during sex.  No discharge.  No new sexual partners (married)  Review of Systems  Genitourinary: Positive for vaginal discharge. Negative for vaginal bleeding and vaginal pain.       Objective:   Physical Exam  Constitutional: She is oriented to person, place, and time. She appears well-developed and well-nourished. No distress.  HENT:  Head: Normocephalic and atraumatic.  Eyes: Conjunctivae are normal.  Pulmonary/Chest: Effort normal.  Abdominal: Soft. She exhibits no distension. There is no tenderness.  Genitourinary:  Genitourinary Comments: nml vulva, no lesion Vagina--pink nml rugae, no discharge  Musculoskeletal: She exhibits no edema.  Neurological: She is alert and oriented to person, place, and time.  Skin: Skin is warm and dry.  Psychiatric: She has a normal mood and affect.  Vitals reviewed.  Vitals:   06/04/17 0910  BP: 129/83  Pulse: 79  Weight: 219 lb (99.3 kg)    Assessment & Plan:  41 yo female with vaginal discharge and odor.  1. Vaginitis and cultures. 2.  Will notify via my chart

## 2017-06-05 LAB — CERVICOVAGINAL ANCILLARY ONLY
BACTERIAL VAGINITIS: POSITIVE — AB
CANDIDA VAGINITIS: NEGATIVE
CHLAMYDIA, DNA PROBE: NEGATIVE
Neisseria Gonorrhea: NEGATIVE
Trichomonas: NEGATIVE

## 2017-06-06 ENCOUNTER — Encounter: Payer: Self-pay | Admitting: Obstetrics & Gynecology

## 2017-06-06 ENCOUNTER — Other Ambulatory Visit: Payer: Self-pay | Admitting: Obstetrics & Gynecology

## 2017-06-06 MED ORDER — METRONIDAZOLE 500 MG PO TABS
500.0000 mg | ORAL_TABLET | Freq: Two times a day (BID) | ORAL | 0 refills | Status: AC
Start: 2017-06-06 — End: ?

## 2017-06-06 NOTE — Progress Notes (Signed)
BV on wet prep--Rx withFlagyl

## 2018-06-15 IMAGING — US US MFM UA CORD DOPPLER
1 series · 15 of 28 positions shown · non-contrast
Comparison: none

[Series 1: us mfm ua cord doppler · 33 acquisitions, 15 frames shown]
[im 1/33]
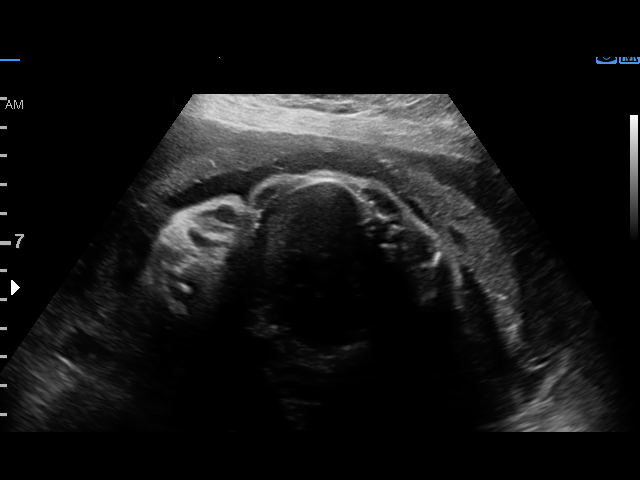
[im 3/33]
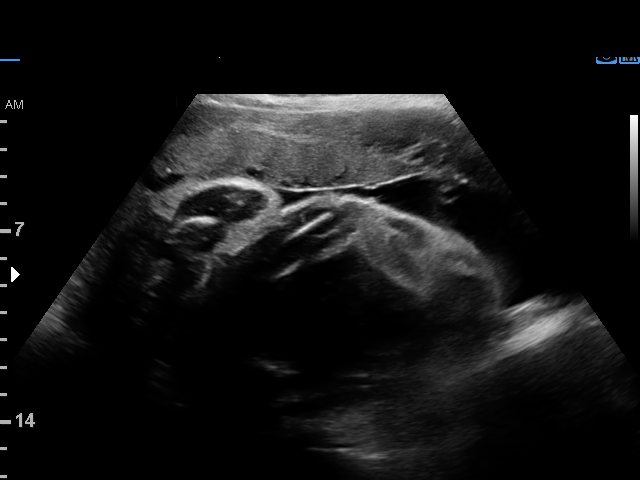
[im 5/33]
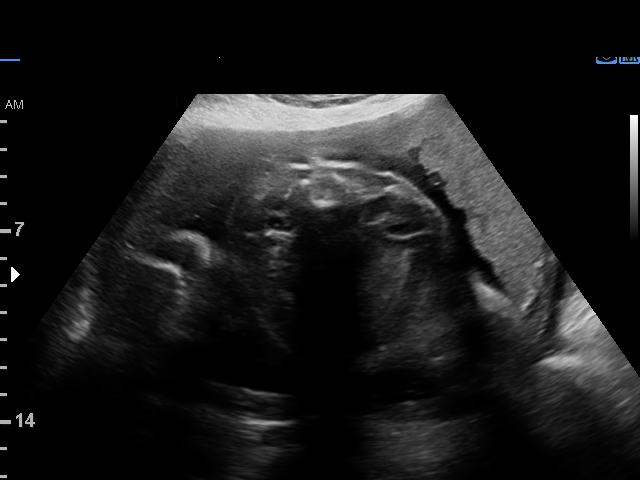
[im 8/33]
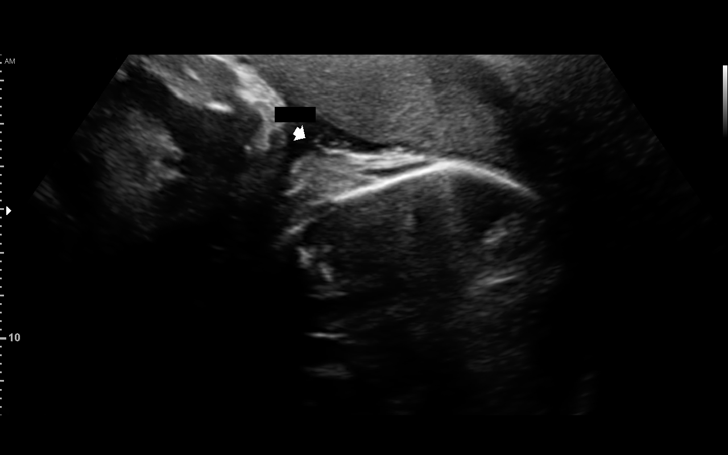
[im 10/33]
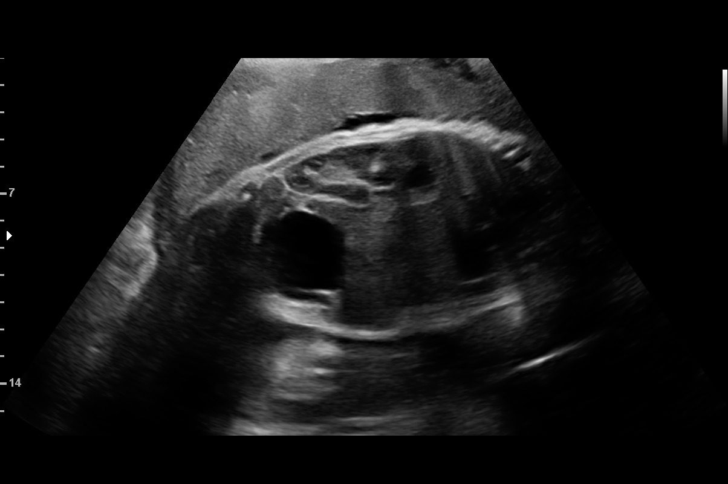
[im 12/33]
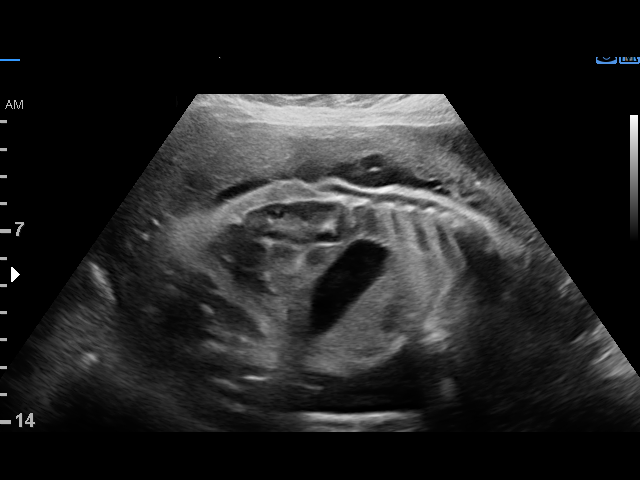
[im 15/33]
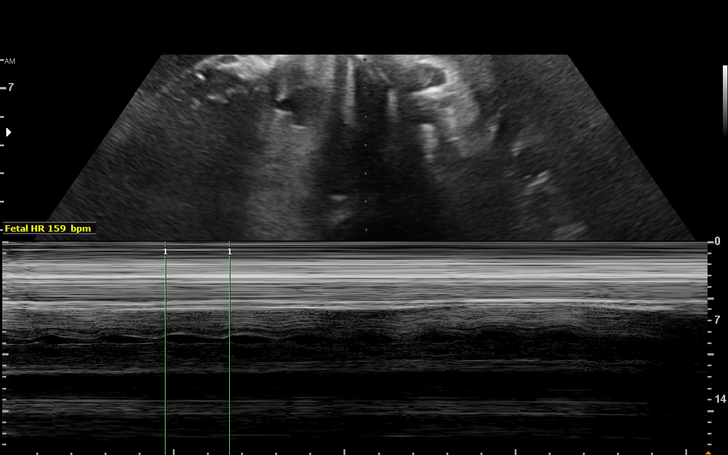
[im 17/33]
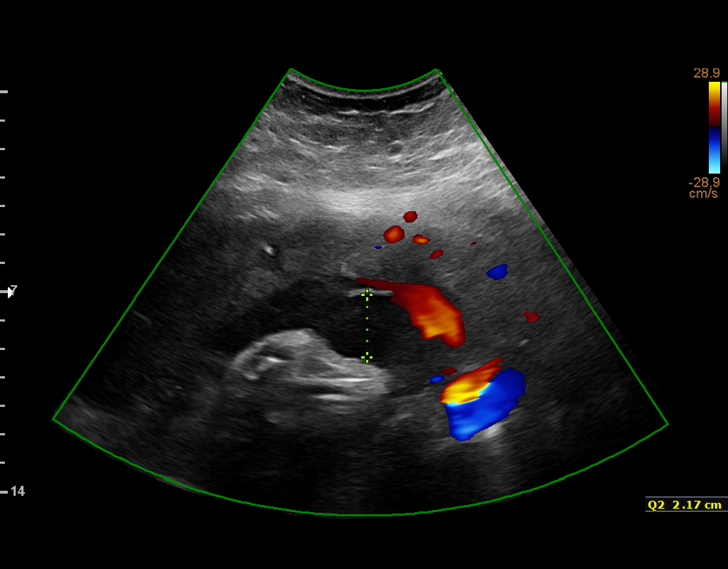
[im 18/33]
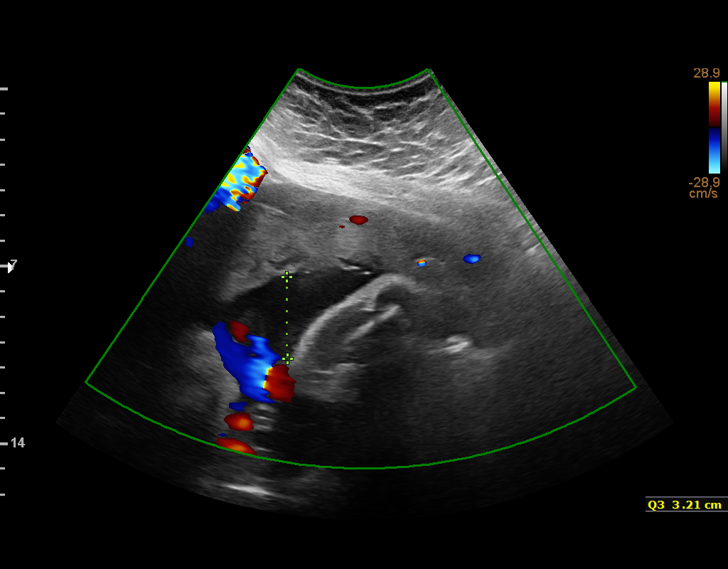
[im 21/33]
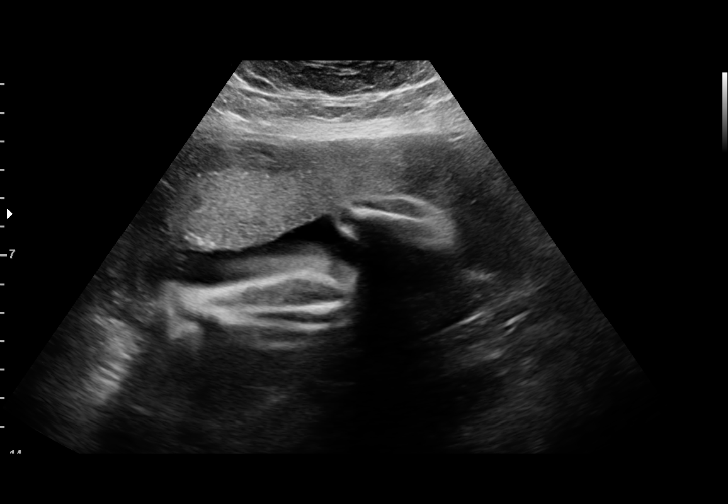
[im 23/33]
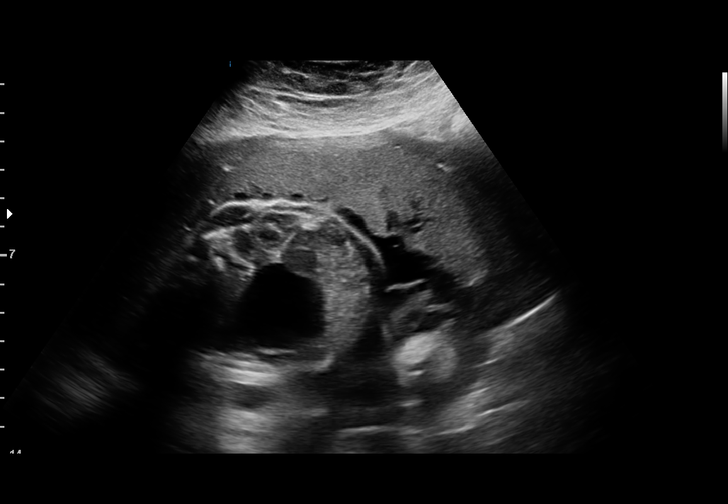
[im 25/33]
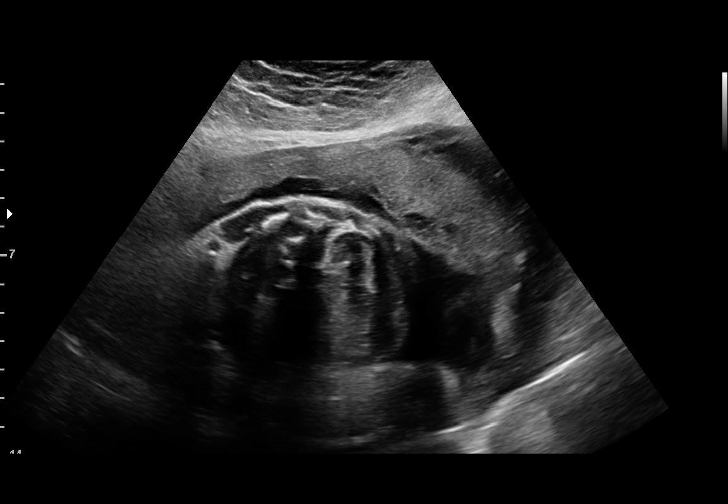
[im 28/33]
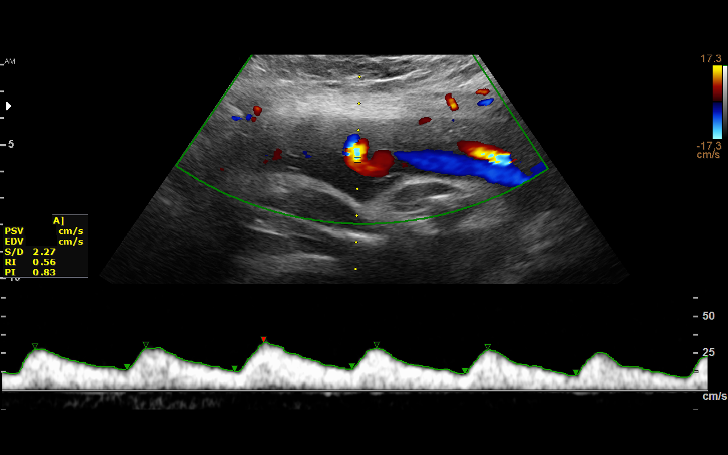
[im 30/33]
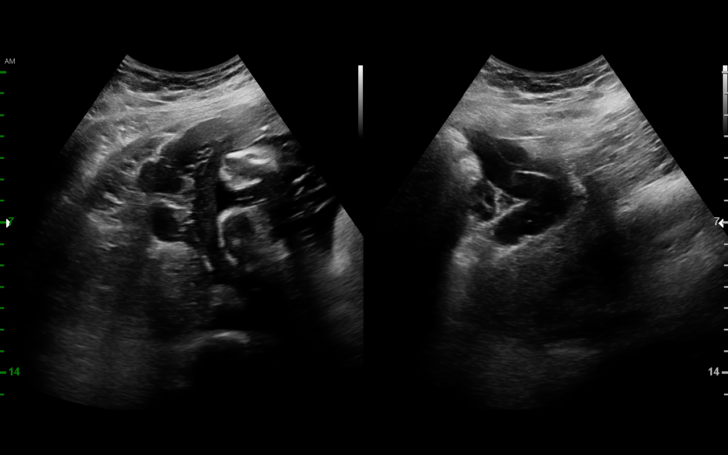
[im 33/33]
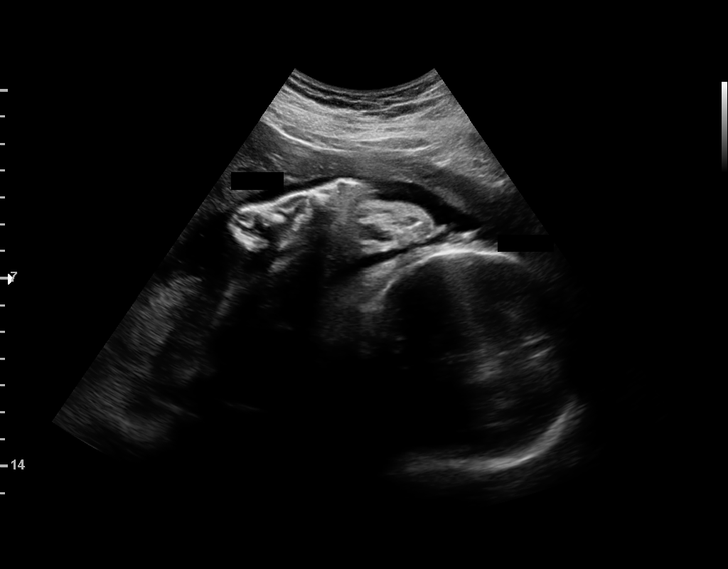

[15 of 28 positions shown; findings below may reference images not displayed]

for [REDACTED]care
Ref. Address:     [id number]

[address]  [address]            [id number]      [id number]     [id number]
2  [facility] [address] [facility]            [id number]      [id number]     [id number]
Indications

32 weeks gestation of pregnancy
Advanced maternal age multigravida 35+,
third trimester (Low risk NIPS)
Poor obstetric history: Previous IUFD
(stillbirth) at 22 weeks
Hypertension - Chronic/Pre-existing on
labetalol
Obesity complicating pregnancy, third
trimester
Maternal care for known or suspected poor
fetal growth, third trimester, not applicable or
unspecified; AC < 3rd %tile
OB History

Blood Type:            Height:  5'7"   Weight (lb):  224      BMI:
Gravidity:    3         Term:   0        Prem:   1        SAB:   0
TOP:          1       Ectopic:  0        Living: 0
Fetal Evaluation

Num Of Fetuses:     1
Fetal Heart         159
Rate(bpm):
Cardiac Activity:   Observed
Presentation:       Cephalic
Amniotic Fluid
AFI FV:      Subjectively within normal limits

AFI Sum(cm)     %Tile       Largest Pocket(cm)
9.61            15

RUQ(cm)       RLQ(cm)       LUQ(cm)        LLQ(cm)
2.97
Biophysical Evaluation

Amniotic F.V:   Within normal limits       F. Tone:        Observed
F. Movement:    Observed                   Score:          [DATE]
F. Breathing:   Observed
Gestational Age

LMP:           32w 6d       Date:   03/21/16                 EDD:   12/26/16
Best:          32w 6d    Det. By:   LMP  (03/21/16)          EDD:   12/26/16
Doppler - Fetal Vessels

Umbilical Artery
S/D     %tile     RI              PI              PSV
(cm/s)
2.57       48

Impression

SIUP at 32+6 weeks
Normal amniotic fluid volume
BPP [DATE]
UA dopplers were normal for this GA
Recommendations

AFI and UA dopplers next week
Growth and UA dopplers in 2 weeks
